# Patient Record
Sex: Female | Born: 2000 | Race: White | Hispanic: No | Marital: Single | Smoking: Never smoker
Health system: Southern US, Community
[De-identification: ages and names within clinical notes are randomized; demographics above are authoritative.]

## PROBLEM LIST (undated history)

## (undated) ENCOUNTER — Emergency Department (HOSPITAL_COMMUNITY): Payer: Managed Care, Other (non HMO)

## (undated) DIAGNOSIS — F319 Bipolar disorder, unspecified: Secondary | ICD-10-CM

## (undated) DIAGNOSIS — Z789 Other specified health status: Secondary | ICD-10-CM

## (undated) DIAGNOSIS — F419 Anxiety disorder, unspecified: Secondary | ICD-10-CM

## (undated) DIAGNOSIS — F4310 Post-traumatic stress disorder, unspecified: Secondary | ICD-10-CM

## (undated) DIAGNOSIS — H539 Unspecified visual disturbance: Secondary | ICD-10-CM

## (undated) DIAGNOSIS — E669 Obesity, unspecified: Secondary | ICD-10-CM

## (undated) HISTORY — DX: Post-traumatic stress disorder, unspecified: F43.10

## (undated) HISTORY — PX: NO PAST SURGERIES: SHX2092

## (undated) HISTORY — DX: Obesity, unspecified: E66.9

## (undated) HISTORY — DX: Anxiety disorder, unspecified: F41.9

---
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
## 2019-01-04 ENCOUNTER — Ambulatory Visit (HOSPITAL_COMMUNITY): Payer: Medicaid Other | Admitting: Psychiatry

## 2019-01-04 ENCOUNTER — Other Ambulatory Visit: Payer: Self-pay

## 2019-01-10 ENCOUNTER — Other Ambulatory Visit: Payer: Self-pay

## 2019-01-10 ENCOUNTER — Encounter (HOSPITAL_COMMUNITY): Payer: Medicaid Other | Admitting: Psychiatry

## 2019-01-10 NOTE — Progress Notes (Signed)
No show

## 2019-01-11 ENCOUNTER — Telehealth (HOSPITAL_COMMUNITY): Payer: Self-pay

## 2019-01-11 NOTE — Telephone Encounter (Signed)
[place] TRACKS PRESCRIPTION COVERAGE APPROVED  QUETIAPINE 50MG  TABLET PA# [id number] EFFECTIVE 01/09/2019 TO 01/04/2020

## 2019-02-22 ENCOUNTER — Other Ambulatory Visit (HOSPITAL_COMMUNITY): Payer: Self-pay | Admitting: Psychiatry

## 2019-02-22 DIAGNOSIS — F411 Generalized anxiety disorder: Secondary | ICD-10-CM

## 2019-02-22 DIAGNOSIS — F4310 Post-traumatic stress disorder, unspecified: Secondary | ICD-10-CM

## 2019-03-01 ENCOUNTER — Telehealth (HOSPITAL_COMMUNITY): Payer: Self-pay | Admitting: *Deleted

## 2019-03-01 NOTE — Telephone Encounter (Signed)
We can provide a 30-day sample and refills until she seen on her appointment.

## 2019-03-14 ENCOUNTER — Other Ambulatory Visit: Payer: Self-pay

## 2019-03-14 ENCOUNTER — Encounter (HOSPITAL_COMMUNITY): Payer: Self-pay | Admitting: Psychiatry

## 2019-03-14 ENCOUNTER — Ambulatory Visit (INDEPENDENT_AMBULATORY_CARE_PROVIDER_SITE_OTHER): Payer: Medicaid Other | Admitting: Psychiatry

## 2019-03-14 VITALS — Wt 260.0 lb

## 2019-03-14 DIAGNOSIS — F4310 Post-traumatic stress disorder, unspecified: Secondary | ICD-10-CM | POA: Diagnosis not present

## 2019-03-14 DIAGNOSIS — F3181 Bipolar II disorder: Secondary | ICD-10-CM | POA: Diagnosis not present

## 2019-03-14 MED ORDER — QUETIAPINE FUMARATE 25 MG PO TABS: 25.0000 mg | ORAL_TABLET | Freq: Every day | ORAL | 0 refills | Status: DC
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
## 2019-04-01 IMAGING — CT CT HEAD W/O CM
3 series · 15 of 46 positions shown, 18 images · non-contrast
Comparison: None.

CLINICAL DATA: Generalized illness

EXAM:
CT HEAD WITHOUT CONTRAST
TECHNIQUE: Contiguous axial images were obtained from the base of the skull
through the vertex without intravenous contrast.

[Series 2: head wo · axial · 0.47mm/px · z∈[+568,+688]mm · 9 of 29 slices shown, 12 images]
[im 3/29  brain]
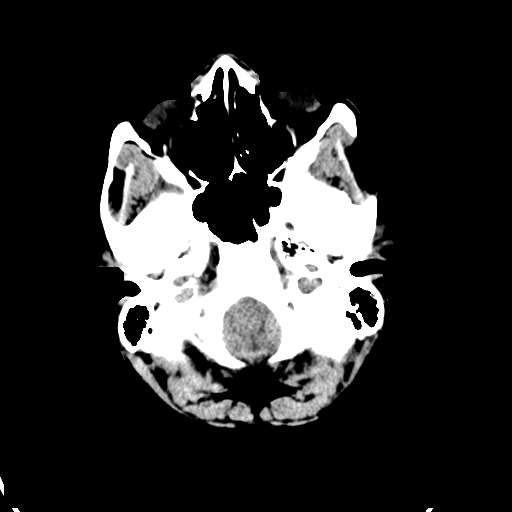
[im 3/29  bone]
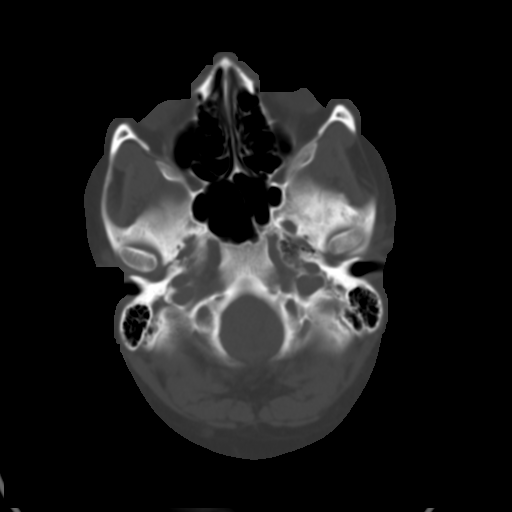
[im 6/29  brain]
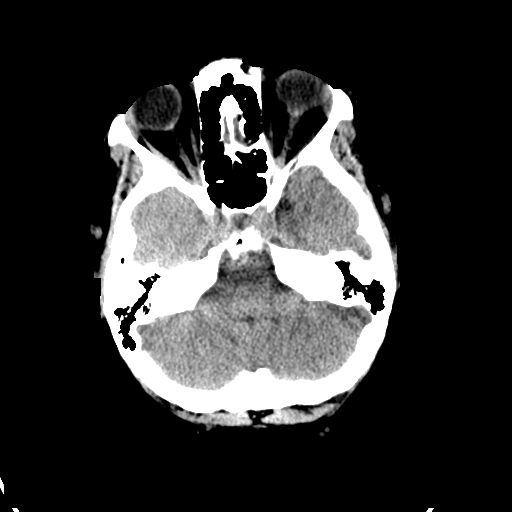
[im 9/29  brain]
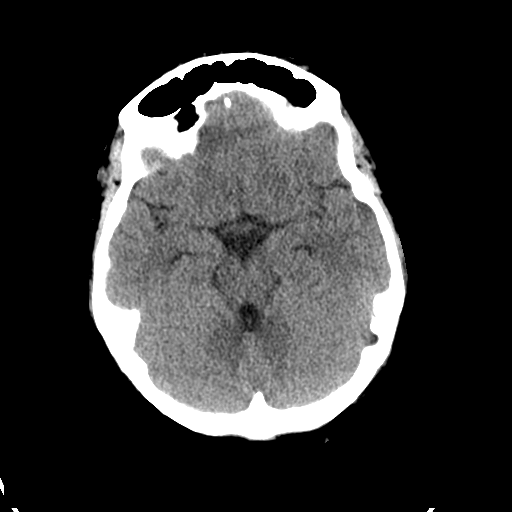
[im 12/29  brain]
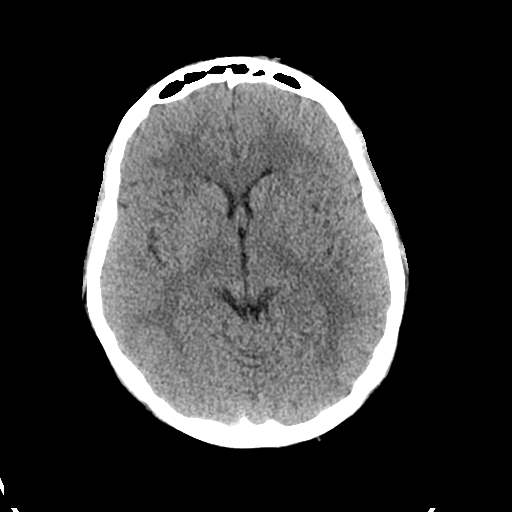
[im 15/29  brain]
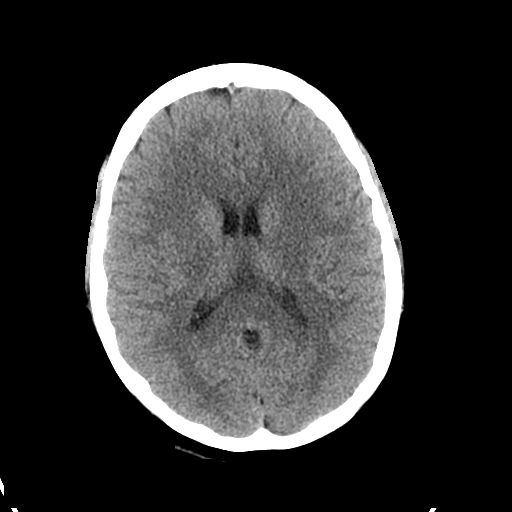
[im 15/29  bone]
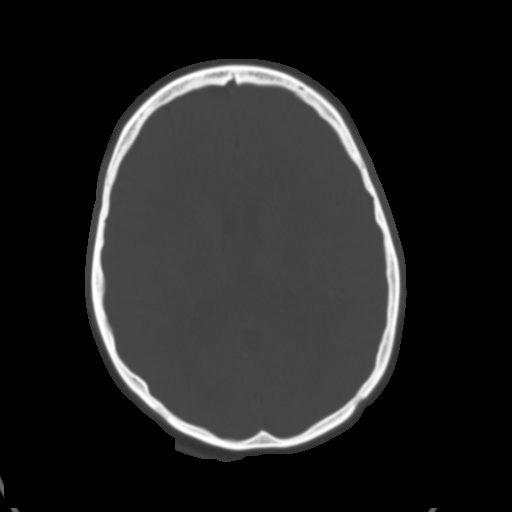
[im 18/29  brain]
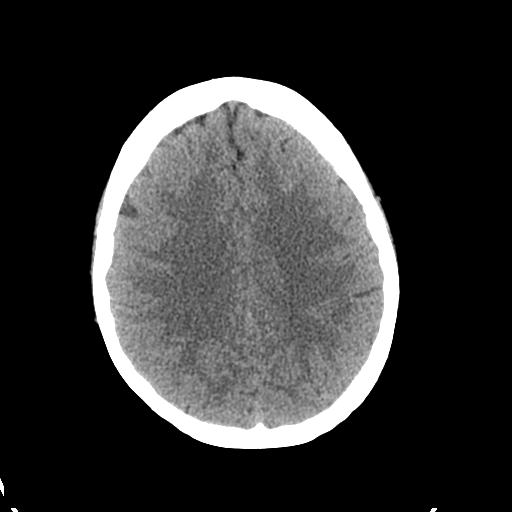
[im 21/29  brain]
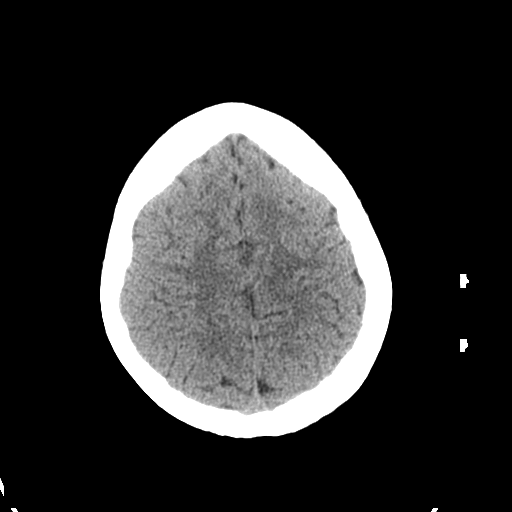
[im 24/29  brain]
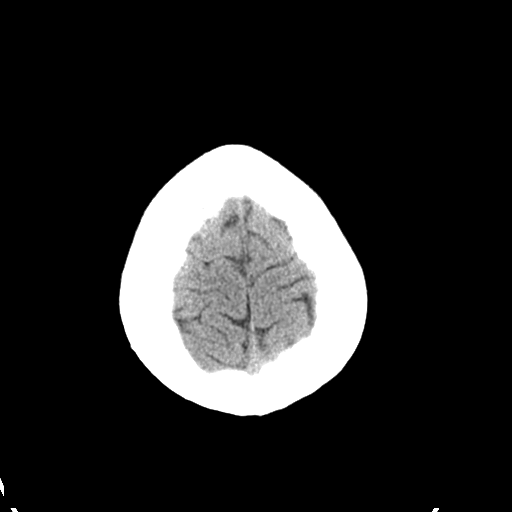
[im 27/29  brain]
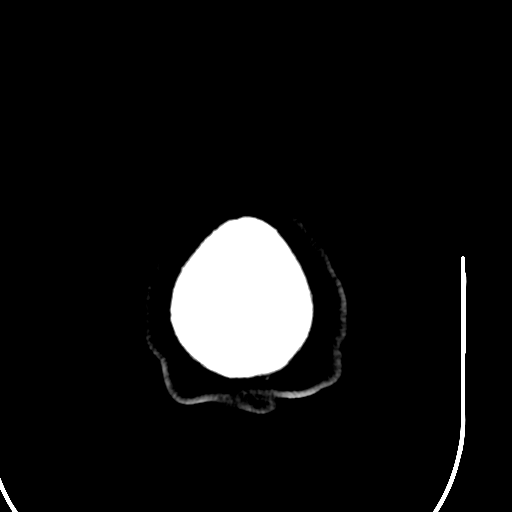
[im 27/29  bone]
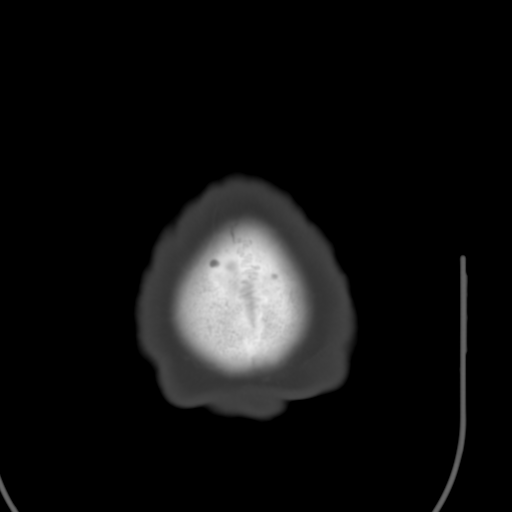

[Series 5: coronal soft tissue · coronal · 0.28mm/px · 3 of 71 slices shown]
[im 24/71  brain]
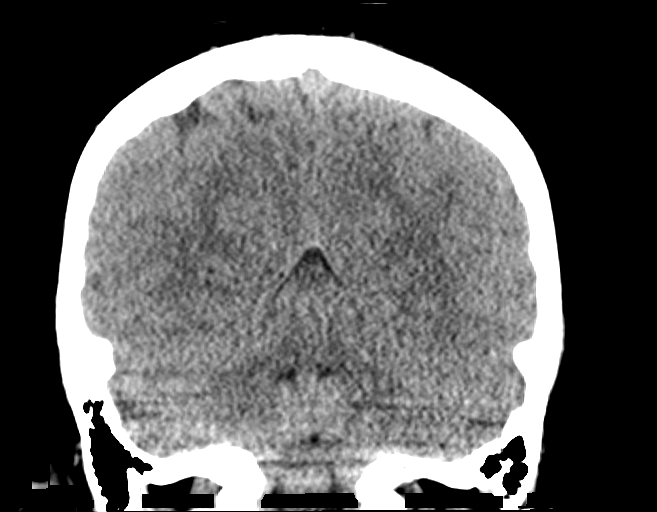
[im 32/71  brain]
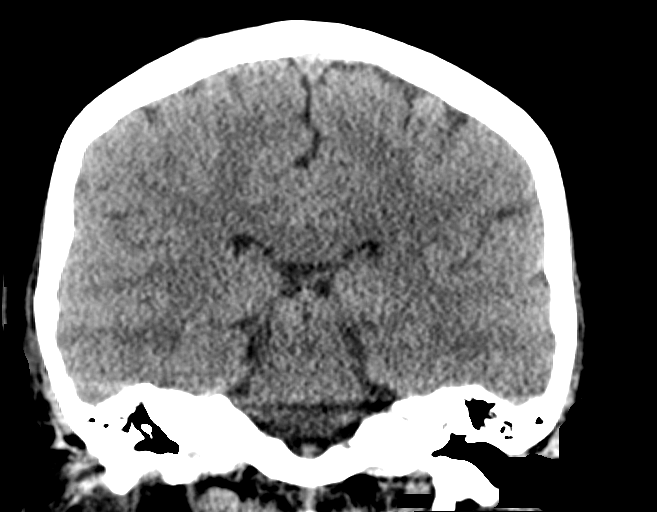
[im 39/71  brain]
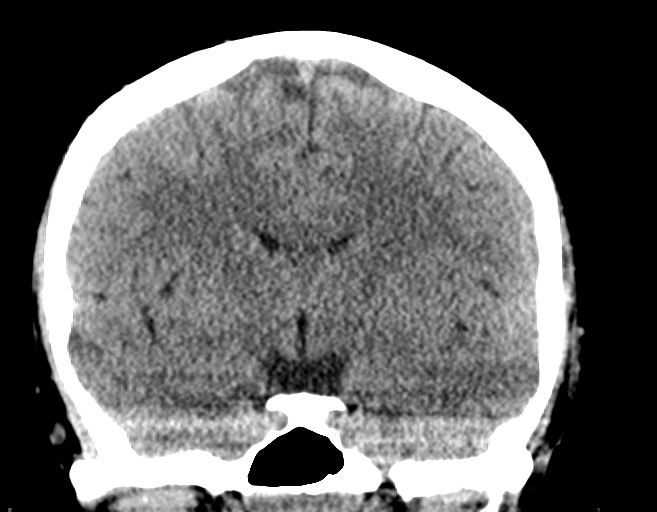

[Series 6: sagittal soft tissue · sagittal · 0.28mm/px · 3 of 61 slices shown]
[im 21/61  brain]
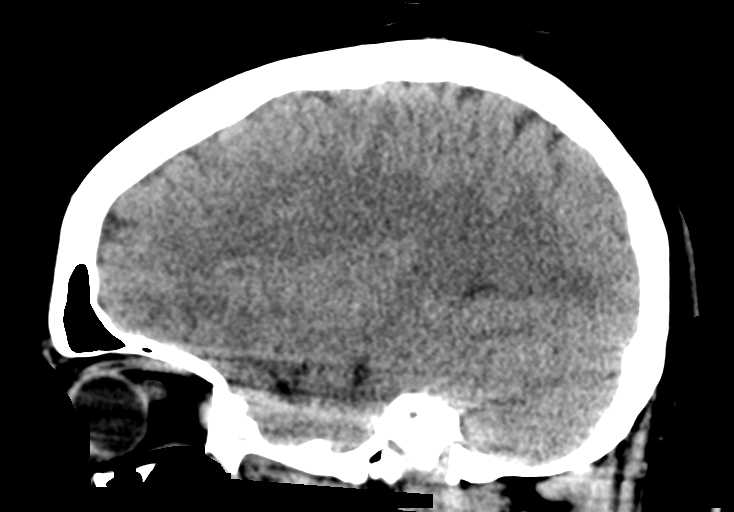
[im 31/61  brain]
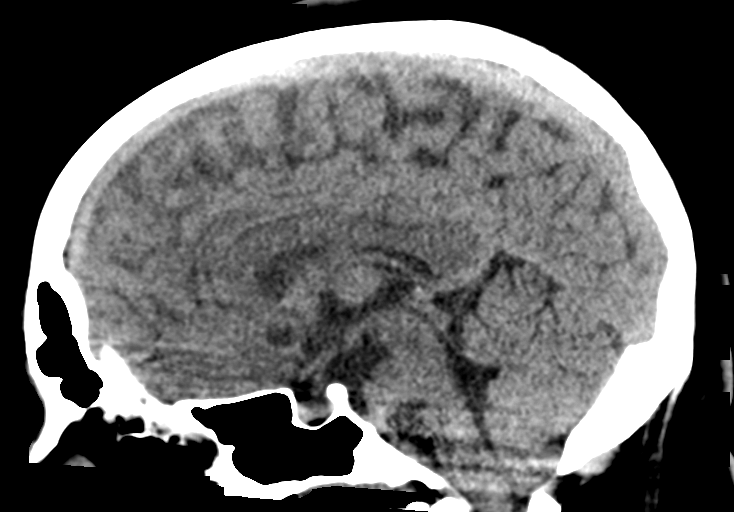
[im 41/61  brain]
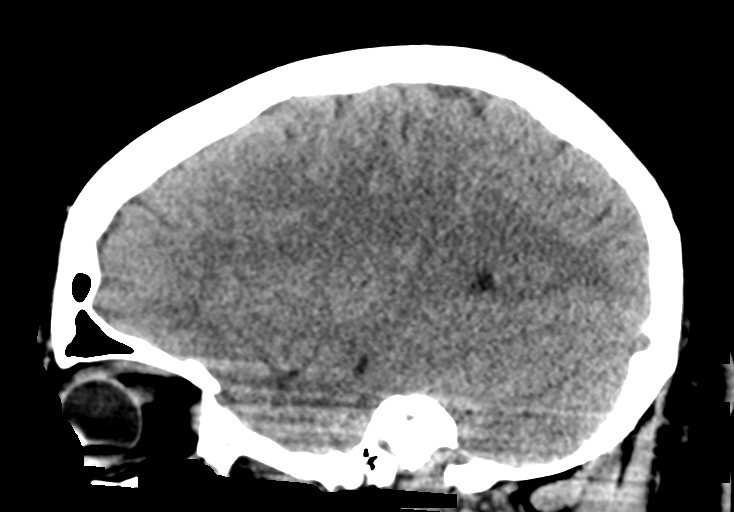

[15 of 46 positions shown; findings below may reference images not displayed]

FINDINGS: Brain: No evidence of acute infarction, hemorrhage, hydrocephalus,
extra-axial collection or mass lesion/mass effect.

Vascular: No hyperdense vessel or unexpected calcification.

Skull: Normal. Negative for fracture or focal lesion.

Sinuses/Orbits: No acute finding.

Other: None.
IMPRESSION: No acute intracranial abnormalities.

## 2019-05-05 ENCOUNTER — Other Ambulatory Visit (HOSPITAL_COMMUNITY): Payer: Self-pay | Admitting: Psychiatry

## 2019-05-05 DIAGNOSIS — F3181 Bipolar II disorder: Secondary | ICD-10-CM

## 2019-05-05 DIAGNOSIS — F4310 Post-traumatic stress disorder, unspecified: Secondary | ICD-10-CM

## 2019-05-15 ENCOUNTER — Encounter (HOSPITAL_COMMUNITY): Payer: Self-pay | Admitting: Psychiatry

## 2019-05-15 ENCOUNTER — Ambulatory Visit (INDEPENDENT_AMBULATORY_CARE_PROVIDER_SITE_OTHER): Payer: Medicaid Other | Admitting: Psychiatry

## 2019-05-15 ENCOUNTER — Other Ambulatory Visit: Payer: Self-pay

## 2019-05-15 DIAGNOSIS — F3181 Bipolar II disorder: Secondary | ICD-10-CM | POA: Diagnosis not present

## 2019-05-15 DIAGNOSIS — F4310 Post-traumatic stress disorder, unspecified: Secondary | ICD-10-CM

## 2019-05-15 MED ORDER — QUETIAPINE FUMARATE 25 MG PO TABS: 25.0000 mg | ORAL_TABLET | Freq: Every day | ORAL | 0 refills | Status: DC
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
## 2019-05-22 ENCOUNTER — Telehealth (HOSPITAL_COMMUNITY): Payer: Self-pay | Admitting: *Deleted

## 2019-05-22 ENCOUNTER — Other Ambulatory Visit (HOSPITAL_COMMUNITY): Payer: Self-pay | Admitting: *Deleted

## 2019-05-22 DIAGNOSIS — F3181 Bipolar II disorder: Secondary | ICD-10-CM

## 2019-05-22 DIAGNOSIS — F4310 Post-traumatic stress disorder, unspecified: Secondary | ICD-10-CM

## 2019-05-22 MED ORDER — QUETIAPINE FUMARATE 25 MG PO TABS: 25.0000 mg | ORAL_TABLET | Freq: Every day | ORAL | 0 refills | Status: DC

## 2019-05-22 NOTE — Telephone Encounter (Signed)
Pt called quite upset that she has been unable to refill the Seroquel 25mg  and that her pharmacy [facility] on is telling mother that pt has to wait 12 more days to refill. Epic showing rx sent in by MD on 05/15/19 #90. Writer was unable to get through on the [facility] pharmacy provider line. Mother asked if we could change pharmacy to the [place] on [address]. As she has had many issues with the other [facility] on [address]. Writer did transfer Rx to preferred pharmacy. Showing received. Mother instructed to call if an yissues arise.

## 2019-05-23 NOTE — Telephone Encounter (Signed)
Thanks for update

## 2019-08-09 ENCOUNTER — Encounter (HOSPITAL_COMMUNITY): Payer: Self-pay | Admitting: Psychiatry

## 2019-08-09 ENCOUNTER — Ambulatory Visit (INDEPENDENT_AMBULATORY_CARE_PROVIDER_SITE_OTHER): Payer: Medicaid Other | Admitting: Psychiatry

## 2019-08-09 ENCOUNTER — Other Ambulatory Visit: Payer: Self-pay

## 2019-08-09 DIAGNOSIS — F4310 Post-traumatic stress disorder, unspecified: Secondary | ICD-10-CM

## 2019-08-09 DIAGNOSIS — F3181 Bipolar II disorder: Secondary | ICD-10-CM

## 2019-08-09 MED ORDER — QUETIAPINE FUMARATE 25 MG PO TABS: 25.0000 mg | ORAL_TABLET | Freq: Every day | ORAL | 0 refills | Status: DC
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
## 2019-08-15 ENCOUNTER — Ambulatory Visit (HOSPITAL_COMMUNITY): Payer: Medicaid Other | Admitting: Psychiatry

## 2019-11-12 ENCOUNTER — Encounter (HOSPITAL_COMMUNITY): Payer: Self-pay | Admitting: Psychiatry

## 2019-11-12 ENCOUNTER — Other Ambulatory Visit: Payer: Self-pay

## 2019-11-12 ENCOUNTER — Telehealth (INDEPENDENT_AMBULATORY_CARE_PROVIDER_SITE_OTHER): Payer: Medicaid Other | Admitting: Psychiatry

## 2019-11-12 DIAGNOSIS — F4310 Post-traumatic stress disorder, unspecified: Secondary | ICD-10-CM

## 2019-11-12 DIAGNOSIS — F3181 Bipolar II disorder: Secondary | ICD-10-CM

## 2019-11-12 MED ORDER — QUETIAPINE FUMARATE 25 MG PO TABS: 25.0000 mg | ORAL_TABLET | Freq: Every day | ORAL | 0 refills | Status: DC
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
## 2019-12-31 ENCOUNTER — Other Ambulatory Visit (HOSPITAL_COMMUNITY): Payer: Self-pay | Admitting: Psychiatry

## 2019-12-31 DIAGNOSIS — F4310 Post-traumatic stress disorder, unspecified: Secondary | ICD-10-CM

## 2019-12-31 DIAGNOSIS — F3181 Bipolar II disorder: Secondary | ICD-10-CM

## 2020-02-12 ENCOUNTER — Encounter (HOSPITAL_COMMUNITY): Payer: Self-pay | Admitting: Psychiatry

## 2020-02-12 ENCOUNTER — Telehealth (INDEPENDENT_AMBULATORY_CARE_PROVIDER_SITE_OTHER): Payer: Medicaid Other | Admitting: Psychiatry

## 2020-02-12 ENCOUNTER — Other Ambulatory Visit: Payer: Self-pay

## 2020-02-12 DIAGNOSIS — F4310 Post-traumatic stress disorder, unspecified: Secondary | ICD-10-CM

## 2020-02-12 DIAGNOSIS — F3181 Bipolar II disorder: Secondary | ICD-10-CM | POA: Diagnosis not present

## 2020-02-12 MED ORDER — QUETIAPINE FUMARATE 25 MG PO TABS: 25.0000 mg | ORAL_TABLET | Freq: Every day | ORAL | 0 refills | Status: DC
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
## 2020-03-29 ENCOUNTER — Other Ambulatory Visit (HOSPITAL_COMMUNITY): Payer: Self-pay | Admitting: Psychiatry

## 2020-03-29 DIAGNOSIS — F4310 Post-traumatic stress disorder, unspecified: Secondary | ICD-10-CM

## 2020-03-29 DIAGNOSIS — F3181 Bipolar II disorder: Secondary | ICD-10-CM

## 2020-05-12 ENCOUNTER — Telehealth (INDEPENDENT_AMBULATORY_CARE_PROVIDER_SITE_OTHER): Payer: Medicaid Other | Admitting: Psychiatry

## 2020-05-12 ENCOUNTER — Other Ambulatory Visit: Payer: Self-pay

## 2020-05-12 ENCOUNTER — Encounter (HOSPITAL_COMMUNITY): Payer: Self-pay | Admitting: Psychiatry

## 2020-05-12 DIAGNOSIS — F4310 Post-traumatic stress disorder, unspecified: Secondary | ICD-10-CM

## 2020-05-12 DIAGNOSIS — F3181 Bipolar II disorder: Secondary | ICD-10-CM

## 2020-05-12 MED ORDER — QUETIAPINE FUMARATE 25 MG PO TABS: 25.0000 mg | ORAL_TABLET | Freq: Every day | ORAL | 0 refills | Status: DC
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
## 2020-05-14 ENCOUNTER — Other Ambulatory Visit (HOSPITAL_COMMUNITY): Payer: Self-pay | Admitting: Psychiatry

## 2020-05-14 DIAGNOSIS — F3181 Bipolar II disorder: Secondary | ICD-10-CM

## 2020-05-14 DIAGNOSIS — F4310 Post-traumatic stress disorder, unspecified: Secondary | ICD-10-CM

## 2020-07-02 ENCOUNTER — Other Ambulatory Visit: Payer: Self-pay

## 2020-07-02 ENCOUNTER — Encounter: Payer: Self-pay | Admitting: Emergency Medicine

## 2020-07-02 ENCOUNTER — Ambulatory Visit
Admission: EM | Admit: 2020-07-02 | Discharge: 2020-07-02 | Disposition: A | Discharge status: Home / Self Care | Payer: Medicaid Other | Attending: Emergency Medicine | Admitting: Emergency Medicine

## 2020-07-02 DIAGNOSIS — S0990XA Unspecified injury of head, initial encounter: Secondary | ICD-10-CM

## 2020-07-02 DIAGNOSIS — S060X0A Concussion without loss of consciousness, initial encounter: Secondary | ICD-10-CM

## 2020-07-02 HISTORY — DX: Bipolar disorder, unspecified: F31.9

## 2020-07-02 MED ORDER — IBUPROFEN 800 MG PO TABS: 800.0000 mg | ORAL_TABLET | Freq: Three times a day (TID) | ORAL | 0 refills | Status: DC
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
## 2020-07-02 NOTE — Discharge Instructions (Signed)
Ibuprofen and Tylenol for headache/soreness Rest and avoid screens Gradually ease back into work/activities, if you are developing symptoms please continue to rest Follow-up with sports medicine
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
## 2020-07-10 ENCOUNTER — Ambulatory Visit
Admission: EM | Admit: 2020-07-10 | Discharge: 2020-07-10 | Disposition: A | Discharge status: Home / Self Care | Payer: Medicaid Other | Attending: Nurse Practitioner | Admitting: Nurse Practitioner

## 2020-07-10 ENCOUNTER — Other Ambulatory Visit: Payer: Self-pay | Admitting: Nurse Practitioner

## 2020-07-10 ENCOUNTER — Other Ambulatory Visit: Payer: Self-pay

## 2020-07-10 DIAGNOSIS — J069 Acute upper respiratory infection, unspecified: Secondary | ICD-10-CM

## 2020-07-10 DIAGNOSIS — Z20822 Contact with and (suspected) exposure to covid-19: Secondary | ICD-10-CM

## 2020-07-10 DIAGNOSIS — N912 Amenorrhea, unspecified: Secondary | ICD-10-CM

## 2020-07-10 LAB — POCT URINE PREGNANCY: Preg Test, Ur: POSITIVE — AB

## 2020-07-10 MED ORDER — OSELTAMIVIR PHOSPHATE 75 MG PO CAPS: 75.0000 mg | ORAL_CAPSULE | Freq: Two times a day (BID) | ORAL | 0 refills | Status: DC

## 2020-07-10 MED ORDER — GUAIFENESIN 100 MG/5ML PO SYRP: 200.0000 mg | ORAL_SOLUTION | ORAL | 0 refills | Status: DC | PRN

## 2020-07-10 MED ORDER — ALBUTEROL SULFATE HFA 108 (90 BASE) MCG/ACT IN AERS: 1.0000 | INHALATION_SPRAY | Freq: Four times a day (QID) | RESPIRATORY_TRACT | 0 refills | Status: DC | PRN

## 2020-07-10 NOTE — Discharge Instructions (Addendum)
Take medications as prescribed. You may take tylenol as needed for fevers/headache/body aches. Drink plenty of fluids. Stay in home isolation until you receive results of your COVID test. You will only be notified for positive results. You may go online to MyChart in the next few days and review your results. Go to the ED immediately if you get worse or have any other symptoms.

## 2020-07-10 NOTE — ED Triage Notes (Signed)
Pt c/o fever 101.1 yesterday with cough, chest congestion, and body aches. States took ibuprofen and benadryl yesterday.
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
## 2020-07-11 LAB — COVID-19, FLU A+B NAA
Influenza A, NAA: NOT DETECTED
Influenza B, NAA: NOT DETECTED
SARS-CoV-2, NAA: NOT DETECTED

## 2020-07-12 LAB — SPECIMEN STATUS REPORT

## 2020-07-12 LAB — BETA HCG QUANT (REF LAB): hCG Quant: 26 m[IU]/mL

## 2020-07-16 ENCOUNTER — Telehealth (INDEPENDENT_AMBULATORY_CARE_PROVIDER_SITE_OTHER): Payer: Medicaid Other | Admitting: Psychiatry

## 2020-07-16 ENCOUNTER — Encounter (HOSPITAL_COMMUNITY): Payer: Self-pay | Admitting: Psychiatry

## 2020-07-16 ENCOUNTER — Other Ambulatory Visit: Payer: Self-pay

## 2020-07-16 VITALS — Wt 242.0 lb

## 2020-07-16 DIAGNOSIS — F4310 Post-traumatic stress disorder, unspecified: Secondary | ICD-10-CM | POA: Diagnosis not present

## 2020-07-16 DIAGNOSIS — F3181 Bipolar II disorder: Secondary | ICD-10-CM | POA: Diagnosis not present
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
## 2020-08-06 ENCOUNTER — Telehealth (HOSPITAL_COMMUNITY): Payer: Medicaid Other | Admitting: Psychiatry

## 2020-08-25 DIAGNOSIS — O09899 Supervision of other high risk pregnancies, unspecified trimester: Secondary | ICD-10-CM | POA: Insufficient documentation

## 2020-08-25 DIAGNOSIS — Z2839 Other underimmunization status: Secondary | ICD-10-CM | POA: Insufficient documentation

## 2020-10-08 ENCOUNTER — Other Ambulatory Visit: Payer: Self-pay

## 2020-10-08 ENCOUNTER — Telehealth (INDEPENDENT_AMBULATORY_CARE_PROVIDER_SITE_OTHER): Payer: BC Managed Care – PPO | Admitting: Psychiatry

## 2020-10-08 ENCOUNTER — Encounter (HOSPITAL_COMMUNITY): Payer: Self-pay | Admitting: Psychiatry

## 2020-10-08 VITALS — Wt 250.0 lb

## 2020-10-08 DIAGNOSIS — F4310 Post-traumatic stress disorder, unspecified: Secondary | ICD-10-CM

## 2020-10-08 DIAGNOSIS — F3181 Bipolar II disorder: Secondary | ICD-10-CM | POA: Diagnosis not present
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
## 2020-10-14 ENCOUNTER — Other Ambulatory Visit: Payer: Self-pay

## 2020-10-14 ENCOUNTER — Ambulatory Visit (INDEPENDENT_AMBULATORY_CARE_PROVIDER_SITE_OTHER): Payer: BC Managed Care – PPO | Admitting: Licensed Clinical Social Worker

## 2020-10-14 DIAGNOSIS — F419 Anxiety disorder, unspecified: Secondary | ICD-10-CM

## 2020-10-14 DIAGNOSIS — F32A Depression, unspecified: Secondary | ICD-10-CM | POA: Diagnosis not present

## 2020-10-14 DIAGNOSIS — F4310 Post-traumatic stress disorder, unspecified: Secondary | ICD-10-CM | POA: Diagnosis not present
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
## 2020-10-22 ENCOUNTER — Other Ambulatory Visit: Payer: Self-pay

## 2020-10-22 ENCOUNTER — Telehealth (INDEPENDENT_AMBULATORY_CARE_PROVIDER_SITE_OTHER): Payer: BC Managed Care – PPO | Admitting: Psychiatry

## 2020-10-22 DIAGNOSIS — F3181 Bipolar II disorder: Secondary | ICD-10-CM

## 2020-10-22 DIAGNOSIS — F4310 Post-traumatic stress disorder, unspecified: Secondary | ICD-10-CM

## 2020-10-22 MED ORDER — HALOPERIDOL 1 MG PO TABS: ORAL_TABLET | ORAL | 2 refills | Status: DC
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
## 2020-10-27 ENCOUNTER — Telehealth (HOSPITAL_COMMUNITY): Payer: Self-pay | Admitting: *Deleted

## 2020-10-27 NOTE — Telephone Encounter (Signed)
I am faxing GeneSight results to her OB/GYN at [facility].  Pt agreeable.

## 2020-10-27 NOTE — Telephone Encounter (Signed)
She had tried SSRIs in the past (list is in the chart).  Her Gene sight testing shows Viibryd, Trintellix and Pristiq and she can discuss with her OB/GYN about these medication but they are not SSRIs.

## 2020-10-27 NOTE — Telephone Encounter (Signed)
Pt called stating that she is not going to take the Haldol as her gyn suggests SSRI instead and she has been looking up s/e and so does not intend to take med. Pt has an appointment scheduled for 11/25/20. Please review and advise. Thanks.

## 2020-10-29 ENCOUNTER — Ambulatory Visit (INDEPENDENT_AMBULATORY_CARE_PROVIDER_SITE_OTHER): Payer: BC Managed Care – PPO | Admitting: Clinical

## 2020-10-29 ENCOUNTER — Other Ambulatory Visit: Payer: Self-pay

## 2020-10-29 DIAGNOSIS — F419 Anxiety disorder, unspecified: Secondary | ICD-10-CM

## 2020-10-29 DIAGNOSIS — F4310 Post-traumatic stress disorder, unspecified: Secondary | ICD-10-CM

## 2020-10-29 DIAGNOSIS — F32A Depression, unspecified: Secondary | ICD-10-CM
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
## 2020-11-12 ENCOUNTER — Other Ambulatory Visit: Payer: Self-pay

## 2020-11-12 ENCOUNTER — Encounter (HOSPITAL_COMMUNITY): Payer: Self-pay | Admitting: Clinical

## 2020-11-12 ENCOUNTER — Ambulatory Visit (HOSPITAL_COMMUNITY): Payer: BC Managed Care – PPO | Admitting: Clinical

## 2020-11-25 ENCOUNTER — Encounter (HOSPITAL_COMMUNITY): Payer: Self-pay | Admitting: Psychiatry

## 2020-11-25 ENCOUNTER — Telehealth (INDEPENDENT_AMBULATORY_CARE_PROVIDER_SITE_OTHER): Payer: BC Managed Care – PPO | Admitting: Psychiatry

## 2020-11-25 ENCOUNTER — Other Ambulatory Visit: Payer: Self-pay

## 2020-11-25 VITALS — Wt 254.0 lb

## 2020-11-25 DIAGNOSIS — F3181 Bipolar II disorder: Secondary | ICD-10-CM | POA: Diagnosis not present

## 2020-11-25 DIAGNOSIS — F4310 Post-traumatic stress disorder, unspecified: Secondary | ICD-10-CM | POA: Diagnosis not present
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
## 2020-11-26 ENCOUNTER — Other Ambulatory Visit: Payer: Self-pay

## 2020-11-26 ENCOUNTER — Ambulatory Visit (INDEPENDENT_AMBULATORY_CARE_PROVIDER_SITE_OTHER): Payer: BC Managed Care – PPO | Admitting: Clinical

## 2020-11-26 DIAGNOSIS — F32A Depression, unspecified: Secondary | ICD-10-CM

## 2020-11-26 DIAGNOSIS — F419 Anxiety disorder, unspecified: Secondary | ICD-10-CM | POA: Diagnosis not present

## 2020-11-26 DIAGNOSIS — F4310 Post-traumatic stress disorder, unspecified: Secondary | ICD-10-CM | POA: Diagnosis not present
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
## 2020-12-10 ENCOUNTER — Other Ambulatory Visit: Payer: Self-pay

## 2020-12-10 ENCOUNTER — Ambulatory Visit (INDEPENDENT_AMBULATORY_CARE_PROVIDER_SITE_OTHER): Payer: BC Managed Care – PPO | Admitting: Clinical

## 2020-12-10 DIAGNOSIS — F4310 Post-traumatic stress disorder, unspecified: Secondary | ICD-10-CM | POA: Diagnosis not present

## 2020-12-10 DIAGNOSIS — F32A Depression, unspecified: Secondary | ICD-10-CM | POA: Diagnosis not present

## 2020-12-10 DIAGNOSIS — F419 Anxiety disorder, unspecified: Secondary | ICD-10-CM

## 2020-12-10 NOTE — Progress Notes (Signed)
   THERAPIST PROGRESS NOTE  Session Time: 10am  Participation Level: Active  Behavioral Response: CasualAlertEuthymic  Type of Therapy: Individual Therapy  Treatment Goals addressed: Coping  Interventions: Other: psychoeducation  Summary: . Pt presents in a euthymic mood. Pt reports work is going well and she is able to balance her duties. Pt reports having a strong support system has assisted her in managing unwanted emotions and distressing events. Pt states drawing/painting, caring for animals and fixing things are a part of her self care routine and assist her in coping with unwanted emotions. Pt reports social support and coping skills are her most valuable protective factors.  Suicidal/Homicidal: Pt denies SI/HI no plan or intent to harm self of others reported.    Therapist Response: Todays session spent discussing what protective factors are and the protective factors that are in and out of the pt control. CSW actively listened as pt gave herself strong and moderate ratings in physical health, sense of purpose and self esteem protective factors. CSW probed for feedback on how her most valuable protective factors are used during difficult times.  Plan: Pt request to return for follow up appt in 1 month. Pt states she is pleased with the progress she is making and request 1 month follow up appt.  Diagnosis: Axis I: PTSD                                     Anxiety and depression    Axis II: No diagnosis    Suzan Slick, LCSW 12/10/2020

## 2021-01-08 ENCOUNTER — Ambulatory Visit (INDEPENDENT_AMBULATORY_CARE_PROVIDER_SITE_OTHER): Payer: BC Managed Care – PPO | Admitting: Clinical

## 2021-01-08 ENCOUNTER — Other Ambulatory Visit: Payer: Self-pay

## 2021-01-08 ENCOUNTER — Telehealth (HOSPITAL_COMMUNITY): Payer: BC Managed Care – PPO | Admitting: Psychiatry

## 2021-01-08 DIAGNOSIS — F4310 Post-traumatic stress disorder, unspecified: Secondary | ICD-10-CM | POA: Diagnosis not present

## 2021-01-08 DIAGNOSIS — F32A Depression, unspecified: Secondary | ICD-10-CM | POA: Diagnosis not present

## 2021-01-08 DIAGNOSIS — F419 Anxiety disorder, unspecified: Secondary | ICD-10-CM | POA: Diagnosis not present

## 2021-01-08 NOTE — Progress Notes (Signed)
   THERAPIST PROGRESS NOTE  Session Time: 10am  Participation Level: Active  Behavioral Response: CasualAlert"rollercoaster"  Type of Therapy: Individual Therapy  Treatment Goals addressed: Coping  Interventions: Supportive  Summary: Pt describes her mood as a "rollercoaster" pt says she is 7 months pregnant and experiencing hormonal/mood changes. When asked to rate her overall mood pt says she is a 5/10(10=positive mood). Pt reports she was previously prescribed seroquel but physician stopped medication when she discovered she was pregnant. Since stopping the medication, pt says she has been experiencing mild depressive sxs. Pt discussed experiencing periods of sadness during the anniversary date of her deceased pet. Nevertheless, pt states having a strong support system is assisting her with managing mood changes and distressing events. Suicidal/Homicidal: Pt denies SI/HI no plan or intent to harm self of others reported.  Therapist Response: CSW actively listened as pt identified mood changes that have been occurring during her pregnancy. CSW probed for feedback on coping strategies to manage mood changes. CSW reviewed and provided pt with emotion regulation strategies to help with mood management.  Plan: Return again in 4 weeks.  Diagnosis: Axis I: PTSD                                     Anxiety and depression    Axis II: No diagnosis    Suzan Slick, LCSW 01/08/2021

## 2021-01-22 ENCOUNTER — Encounter (HOSPITAL_COMMUNITY): Payer: Self-pay | Admitting: Psychiatry

## 2021-01-22 ENCOUNTER — Other Ambulatory Visit: Payer: Self-pay

## 2021-01-22 ENCOUNTER — Telehealth (HOSPITAL_BASED_OUTPATIENT_CLINIC_OR_DEPARTMENT_OTHER): Payer: BC Managed Care – PPO | Admitting: Psychiatry

## 2021-01-22 VITALS — Wt 264.0 lb

## 2021-01-22 DIAGNOSIS — F4310 Post-traumatic stress disorder, unspecified: Secondary | ICD-10-CM | POA: Diagnosis not present

## 2021-01-22 DIAGNOSIS — F3181 Bipolar II disorder: Secondary | ICD-10-CM | POA: Diagnosis not present
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
## 2021-02-10 ENCOUNTER — Other Ambulatory Visit: Payer: Self-pay

## 2021-02-10 ENCOUNTER — Ambulatory Visit (INDEPENDENT_AMBULATORY_CARE_PROVIDER_SITE_OTHER): Payer: BC Managed Care – PPO | Admitting: Clinical

## 2021-02-10 DIAGNOSIS — F4310 Post-traumatic stress disorder, unspecified: Secondary | ICD-10-CM | POA: Diagnosis not present

## 2021-02-10 DIAGNOSIS — F419 Anxiety disorder, unspecified: Secondary | ICD-10-CM

## 2021-02-10 DIAGNOSIS — F32A Depression, unspecified: Secondary | ICD-10-CM

## 2021-02-10 NOTE — Progress Notes (Signed)
   THERAPIST PROGRESS NOTE  Session Time: 10am  Participation Level: Active  Behavioral Response:  unknown appearanceAlertpleasant  Type of Therapy: Individual Therapy  Treatment Goals addressed: Coping  Interventions: Supportive Virtual Visit via Telephone Note  I connected with Tina Jones on 02/10/21 at 10:00 AM EST by telephone and verified that I am speaking with the correct person using two identifiers.  Location: Patient: home Provider: office   I discussed the limitations, risks, security and privacy concerns of performing an evaluation and management service by telephone and the availability of in person appointments. I also discussed with the patient that there may be a patient responsible charge related to this service. The patient expressed understanding and agreed to proceed.   I discussed the assessment and treatment plan with the patient. The patient was provided an opportunity to ask questions and all were answered. The patient agreed with the plan and demonstrated an understanding of the instructions.   The patient was advised to call back or seek an in-person evaluation if the symptoms worsen or if the condition fails to improve as anticipated.  I provided 40 minutes of non-face-to-face time during this encounter.  Summary: Pt presents in a pleasant mood. Pt states she has felt like she has been on a Insurance risk surveyor during the holiday" Per pt report a few weeks before a holiday she experiences periods of low mood and grieves the loss of her deceased relatives and pets. Pt states 03-03-2023 is her fathers death anniversary. Pt reports increase in anxiety as she prepares for her childs birth in January. Morevover, pt says she finds painting as a healthy distraction and a coping method to manage stress. Pt reports she has not been prescribed any psychotropic medications during her pregnancy.  Suicidal/Homicidal: Pt denies SI/HI no plan, intent or attempt to harm self or others  reported.  Therapist Response: Todays session spent discussing grieving process. CSW provided pt with information on the stages of grief and tips on managing grief during the holiday season.   Plan: Return again in 4 weeks.  Diagnosis: Axis I: PTSD                                     Anxiety and depression    Axis II: No diagnosis    Suzan Slick, LCSW 02/10/2021

## 2021-03-03 ENCOUNTER — Ambulatory Visit (INDEPENDENT_AMBULATORY_CARE_PROVIDER_SITE_OTHER): Payer: BC Managed Care – PPO | Admitting: Clinical

## 2021-03-03 ENCOUNTER — Other Ambulatory Visit: Payer: Self-pay

## 2021-03-03 DIAGNOSIS — F419 Anxiety disorder, unspecified: Secondary | ICD-10-CM | POA: Diagnosis not present

## 2021-03-03 DIAGNOSIS — F32A Depression, unspecified: Secondary | ICD-10-CM | POA: Diagnosis not present

## 2021-03-03 DIAGNOSIS — F4310 Post-traumatic stress disorder, unspecified: Secondary | ICD-10-CM

## 2021-03-03 NOTE — Progress Notes (Signed)
° °  THERAPIST PROGRESS NOTE  Session Time: 10am  Participation Level: Active  Behavioral Response: AlertEuthymic  Type of Therapy: Individual Therapy  Treatment Goals addressed: Coping  Interventions: Supportive Virtual Visit via Telephone Note  I connected with Tina Jones on 03/03/21 at 10:00 AM EST by telephone and verified that I am speaking with the correct person using two identifiers.  Location: Patient: home Provider: office   I discussed the limitations, risks, security and privacy concerns of performing an evaluation and management service by telephone and the availability of in person appointments. I also discussed with the patient that there may be a patient responsible charge related to this service. The patient expressed understanding and agreed to proceed.   I discussed the assessment and treatment plan with the patient. The patient was provided an opportunity to ask questions and all were answered. The patient agreed with the plan and demonstrated an understanding of the instructions.   The patient was advised to call back or seek an in-person evaluation if the symptoms worsen or if the condition fails to improve as anticipated.  I provided of non-face-to-face time during this encounter.  Summary: Pt says she is losing her voice, difficult to understand her at times. Pt reports no concerns and says her mood is stable. Pt says she is preparing for the arrival of her son on 1/10. Pt does not report any changes in sleep pattern, no nightmare or flashback reported. Pt states she has a medication management appt schedule in February where she plans to begin taking psychotropic medication. Pt says no psychotropic medications have been prescribed during her pregnancy. Pt reports having strong support system consisting of her boyfriend and family members.  Suicidal/Homicidal:   Pt denies SI/HI no plan, intent or attempt to harm self or others reported Therapist  Response: Assessed for changes in mood, behavior and daily functioning. Todays session consisted of discussing stress management techniques. Pt identifies cooking, watching cooking TV shows, playing with her pets and drawing in her art book as things that assist her in relaxing. Discussed with pt incorporating these things in her routine when her child is born.  Plan: Return again in 4 weeks.  Diagnosis: Axis I: PTSD                                     Anxiety and depression    Axis II: No diagnosis    Suzan Slick, LCSW 03/03/2021

## 2021-04-07 ENCOUNTER — Encounter: Payer: Self-pay | Admitting: Emergency Medicine

## 2021-04-07 ENCOUNTER — Ambulatory Visit
Admission: EM | Admit: 2021-04-07 | Discharge: 2021-04-07 | Disposition: A | Discharge status: Home / Self Care | Payer: BC Managed Care – PPO | Attending: Internal Medicine | Admitting: Internal Medicine

## 2021-04-07 ENCOUNTER — Other Ambulatory Visit: Payer: Self-pay

## 2021-04-07 DIAGNOSIS — M25532 Pain in left wrist: Secondary | ICD-10-CM

## 2021-04-07 NOTE — Discharge Instructions (Signed)
A wrist brace has been applied.  Please use ice application as well.  You may take Tylenol as needed for pain.  Follow-up with provided contact information for orthopedist for further evaluation and management.

## 2021-04-07 NOTE — ED Triage Notes (Signed)
Pt here for left wrist pain x 3 days; pt sts woke up in funny position from sleep but denies other injury
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
## 2021-04-10 ENCOUNTER — Encounter (HOSPITAL_COMMUNITY): Payer: Self-pay | Admitting: Psychiatry

## 2021-04-10 ENCOUNTER — Other Ambulatory Visit: Payer: Self-pay

## 2021-04-10 ENCOUNTER — Telehealth (HOSPITAL_BASED_OUTPATIENT_CLINIC_OR_DEPARTMENT_OTHER): Payer: Medicaid Other | Admitting: Psychiatry

## 2021-04-10 DIAGNOSIS — F4310 Post-traumatic stress disorder, unspecified: Secondary | ICD-10-CM

## 2021-04-10 DIAGNOSIS — F53 Postpartum depression: Secondary | ICD-10-CM | POA: Diagnosis not present

## 2021-04-10 DIAGNOSIS — F3181 Bipolar II disorder: Secondary | ICD-10-CM

## 2021-04-10 MED ORDER — QUETIAPINE FUMARATE 25 MG PO TABS: 12.5000 mg | ORAL_TABLET | Freq: Every day | ORAL | 0 refills | Status: DC
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
## 2021-04-13 ENCOUNTER — Ambulatory Visit (INDEPENDENT_AMBULATORY_CARE_PROVIDER_SITE_OTHER): Payer: Medicaid Other | Admitting: Clinical

## 2021-04-13 ENCOUNTER — Other Ambulatory Visit: Payer: Self-pay

## 2021-04-13 DIAGNOSIS — F39 Unspecified mood [affective] disorder: Secondary | ICD-10-CM

## 2021-04-13 DIAGNOSIS — F4310 Post-traumatic stress disorder, unspecified: Secondary | ICD-10-CM

## 2021-04-13 DIAGNOSIS — F53 Postpartum depression: Secondary | ICD-10-CM

## 2021-04-13 NOTE — Progress Notes (Signed)
° °  THERAPIST PROGRESS NOTE  Session Time: 10am  Participation Level: Active  Behavioral Response: Alertpleasant  Type of Therapy: Individual Therapy  Treatment Goals addressed: Coping  Interventions: CBT Virtual Visit via Telephone Note  I connected with Tina Jones on 04/13/21 at 10:00 AM EST by telephone and verified that I am speaking with the correct person using two identifiers.  Location: Patient: home Provider: office   I discussed the limitations, risks, security and privacy concerns of performing an evaluation and management service by telephone and the availability of in person appointments. I also discussed with the patient that there may be a patient responsible charge related to this service. The patient expressed understanding and agreed to proceed.   I discussed the assessment and treatment plan with the patient. The patient was provided an opportunity to ask questions and all were answered. The patient agreed with the plan and demonstrated an understanding of the instructions.   The patient was advised to call back or seek an in-person evaluation if the symptoms worsen or if the condition fails to improve as anticipated.  I provided 35 minutes of non-face-to-face time during this encounter.  Summary: Pt reports giving birth to a son. Pt says she is staying with her mother until next appointment and says mother and boyfriend have been very supportive. Pt says she has been experiencing "mom guilt" (thoughts she is not a good mother, when unable to calm and soothe baby). Pt also reports crying spells, increased anxiety, irritability, feeling overwhelmed. Pt states she began taking seroquel to assist with mood management after being off medication during pregnancy.  Suicidal/Homicidal: Pt denies SI/HI no plan, intent or attempt to harm self or others reported. Pt encouraged to call 911 or go to closest ED in an emergency.  Therapist Response: Assessed for changes in mood,  behavior and daily functioning. Discussed cognitive model(thoughts influence feelings and behaviors). Processed putting thoughts on trial to challenge irrational thoughts. Reviewed healthy coping strategies to manage stress such as drawing/painting, getting fresh air/being in nature, social supports.  Plan: Return again in 4 weeks.  Diagnosis: Axis I: Mood disorder    PTSD    Post partum depression    Axis II: No diagnosis    Suzan Slick, LCSW 04/13/2021

## 2021-04-23 ENCOUNTER — Telehealth (HOSPITAL_COMMUNITY): Payer: BC Managed Care – PPO | Admitting: Psychiatry

## 2021-05-08 ENCOUNTER — Encounter (HOSPITAL_COMMUNITY): Payer: Self-pay | Admitting: Psychiatry

## 2021-05-08 ENCOUNTER — Telehealth (HOSPITAL_BASED_OUTPATIENT_CLINIC_OR_DEPARTMENT_OTHER): Payer: Medicaid Other | Admitting: Psychiatry

## 2021-05-08 ENCOUNTER — Other Ambulatory Visit: Payer: Self-pay

## 2021-05-08 VITALS — Wt 252.0 lb

## 2021-05-08 DIAGNOSIS — F3181 Bipolar II disorder: Secondary | ICD-10-CM

## 2021-05-08 DIAGNOSIS — F4310 Post-traumatic stress disorder, unspecified: Secondary | ICD-10-CM

## 2021-05-08 MED ORDER — LAMOTRIGINE 5 MG PO CHEW: 5.0000 mg | CHEWABLE_TABLET | Freq: Every day | ORAL | 1 refills | Status: DC
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
## 2021-05-11 ENCOUNTER — Other Ambulatory Visit: Payer: Self-pay

## 2021-05-11 ENCOUNTER — Ambulatory Visit (INDEPENDENT_AMBULATORY_CARE_PROVIDER_SITE_OTHER): Payer: Medicaid Other | Admitting: Clinical

## 2021-05-11 DIAGNOSIS — F4310 Post-traumatic stress disorder, unspecified: Secondary | ICD-10-CM

## 2021-05-11 DIAGNOSIS — F53 Postpartum depression: Secondary | ICD-10-CM

## 2021-05-11 NOTE — Progress Notes (Signed)
° °  THERAPIST PROGRESS NOTE  Session Time: 10am  Participation Level: Active  Behavioral Response: AlertEuthymic  Type of Therapy: Individual Therapy  Treatment Goals addressed: Coping strategies  ProgressTowards Goals: Progressing  Interventions: Supportive Virtual Visit via Telephone Note  I connected with Tina Jones on 05/11/21 at 10:00 AM EST by telephone and verified that I am speaking with the correct person using two identifiers.  Location: Patient: home Provider: office   I discussed the limitations, risks, security and privacy concerns of performing an evaluation and management service by telephone and the availability of in person appointments. I also discussed with the patient that there may be a patient responsible charge related to this service. The patient expressed understanding and agreed to proceed.   I discussed the assessment and treatment plan with the patient. The patient was provided an opportunity to ask questions and all were answered. The patient agreed with the plan and demonstrated an understanding of the instructions.   The patient was advised to call back or seek an in-person evaluation if the symptoms worsen or if the condition fails to improve as anticipated.  I provided 30 minutes of non-face-to-face time during this encounter.  Summary: Pt presents in euthymic mood. Pt says her son will be 2 months old soon and she is adjusting to motherhood. Pt reports her mother and boyfriend have been very supportive. Pt reports post partum sxs have subsided and mood has improved. Pt states psychiatrist(Dr. Lolly Mustache) prescribed seroquel but pt states it caused her to sleep too much. Per pt she stopped taking the medication two weeks ago. Pt says she has been prescribed a new medication (unable to recall name) but has not began taking it.  Suicidal/Homicidal: Pt denies SI/HI no plan, intent or attempt to harm self or others reported. Pt encouraged to call 911 or go to  closest ED in an emergency.  Therapist Response: Actively listened and supported pt as she discussed her transition into motherhood. Probed for pt self care routine/relaxation methods, pt says she watches TV, takes care of her animals and takes hot showers. Discussed with pt identifying times during day to reset and recharge.  Plan: Return again in 4 weeks.  Diagnosis: Mood disorder                                     PTSD  Collaboration of Care: Other none requested  Patient/Guardian was advised Release of Information must be obtained prior to any record release in order to collaborate their care with an outside provider. Patient/Guardian was advised if they have not already done so to contact the registration department to sign all necessary forms in order for [place] to release information regarding their care.   Consent: Patient/Guardian gives verbal consent for treatment and assignment of benefits for services provided during this visit. Patient/Guardian expressed understanding and agreed to proceed.   Suzan Slick, LCSW 05/11/2021

## 2021-06-08 ENCOUNTER — Ambulatory Visit (INDEPENDENT_AMBULATORY_CARE_PROVIDER_SITE_OTHER): Payer: Medicaid Other | Admitting: Clinical

## 2021-06-08 ENCOUNTER — Other Ambulatory Visit: Payer: Self-pay

## 2021-06-08 DIAGNOSIS — F4310 Post-traumatic stress disorder, unspecified: Secondary | ICD-10-CM | POA: Diagnosis not present

## 2021-06-08 DIAGNOSIS — F53 Postpartum depression: Secondary | ICD-10-CM | POA: Diagnosis not present

## 2021-06-08 DIAGNOSIS — F39 Unspecified mood [affective] disorder: Secondary | ICD-10-CM

## 2021-06-08 NOTE — Progress Notes (Signed)
? ?  THERAPIST PROGRESS NOTE ? ?Session Time: 10am ? ?Participation Level: Active ? ?Behavioral Response: Alerttired ? ?Type of Therapy: Individual Therapy ? ?Treatment Goals addressed:  ? ?ProgressTowards Goals: Progressing ? ?Interventions: Supportive ?Virtual Visit via Telephone Note ? ?I connected with Cleora Fleet on 06/08/21 at 10:00 AM EDT by telephone and verified that I am speaking with the correct person using two identifiers. ? ?Location: ?Patient: home ?Provider: office ?  ?I discussed the limitations, risks, security and privacy concerns of performing an evaluation and management service by telephone and the availability of in person appointments. I also discussed with the patient that there may be a patient responsible charge related to this service. The patient expressed understanding and agreed to proceed. ?  ?I discussed the assessment and treatment plan with the patient. The patient was provided an opportunity to ask questions and all were answered. The patient agreed with the plan and demonstrated an understanding of the instructions. ?  ?The patient was advised to call back or seek an in-person evaluation if the symptoms worsen or if the condition fails to improve as anticipated. ? ?I provided 40 minutes of non-face-to-face time during this encounter.  ?Summary: Pt reports she has been having "depression spells" and endorses difficulty getting out of bed, crying spells, sadness and irritability. Pt denies any psychosis or suicidal or homicidal ideation. Pt states she has been prescribed seroquel and lamictal for mood management. Per pt report, the seroquel makes her "sleepy and zombie like" Pt says she stopped taking it in February 2023. Pt states the lamictal causes increased depressive sxs and says she takes it intermittently, last taken 3/22. Pt reports the medications make it difficult for her to be attentive to her child's needs.  Actively listened and supported pt as she raised questions  about the side effects of medication. Pt says her next medication mgmt visit is on 4/6; encouraged to write down all questions, side effects and concerns prior to visit. Pt encouraged to monitor for behavioral patterns or changes in mood. ?Suicidal/Homicidal: Pt denies SI/HI no plan, intent or attempt to harm self or others reported.  ? ? ?Plan: Return again in 3 weeks. ? ?Diagnosis: Mood disorder ?                                    PTSD ?                                    Post partum depression ? ?Collaboration of Care: Other none requested ? ?Patient/Guardian was advised Release of Information must be obtained prior to any record release in order to collaborate their care with an outside provider. Patient/Guardian was advised if they have not already done so to contact the registration department to sign all necessary forms in order for [place] to release information regarding their care.  ? ?Consent: Patient/Guardian gives verbal consent for treatment and assignment of benefits for services provided during this visit. Patient/Guardian expressed understanding and agreed to proceed.  ? ?Mariadelcarmen Corella Philip Aspen, LCSW ?06/08/2021 ? ?

## 2021-06-18 ENCOUNTER — Encounter (HOSPITAL_COMMUNITY): Payer: Self-pay | Admitting: Psychiatry

## 2021-06-18 ENCOUNTER — Telehealth (HOSPITAL_BASED_OUTPATIENT_CLINIC_OR_DEPARTMENT_OTHER): Payer: Medicaid Other | Admitting: Psychiatry

## 2021-06-18 VITALS — Wt 250.0 lb

## 2021-06-18 DIAGNOSIS — F3181 Bipolar II disorder: Secondary | ICD-10-CM

## 2021-06-18 DIAGNOSIS — F4310 Post-traumatic stress disorder, unspecified: Secondary | ICD-10-CM

## 2021-06-18 MED ORDER — ARIPIPRAZOLE 2 MG PO TABS: 2.0000 mg | ORAL_TABLET | Freq: Every day | ORAL | 0 refills | Status: DC

## 2021-06-18 NOTE — Addendum Note (Signed)
Addended by: Kathryne Sharper T on: 06/18/2021 02:03 PM ? ? Modules accepted: Orders ? ?
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
## 2021-06-19 ENCOUNTER — Telehealth (HOSPITAL_COMMUNITY): Payer: Self-pay

## 2021-06-19 NOTE — Telephone Encounter (Signed)
[employer] TR[employer]S PRESCRIPTION COVERAGE ?APPROVED  ARIPIPRAZOLE 2MG  TABLET ?PA # ?EFFECTIVE 06/19/2021 TO 06/14/2022 ? ?S/W [facility] ?REFERENCE ID # [id number] ?NOTIFIED PHARMACY. PT IS SET UP FOR TEXT MESSAGING SO WILL BE NOTIFIED WHEN MEDICATION IS READY FOR P/U ?

## 2021-07-02 ENCOUNTER — Ambulatory Visit (INDEPENDENT_AMBULATORY_CARE_PROVIDER_SITE_OTHER): Payer: Medicaid Other | Admitting: Clinical

## 2021-07-02 DIAGNOSIS — F4310 Post-traumatic stress disorder, unspecified: Secondary | ICD-10-CM | POA: Diagnosis not present

## 2021-07-02 DIAGNOSIS — F39 Unspecified mood [affective] disorder: Secondary | ICD-10-CM | POA: Diagnosis not present

## 2021-07-02 NOTE — Progress Notes (Signed)
? ?  THERAPIST PROGRESS NOTE ? ?Session Time: 11am ? ?Participation Level: Active ? ?Behavioral Response: Alertpleasant ? ?Type of Therapy: Individual Therapy ? ?Treatment Goals addressed: healthy coping ? ?ProgressTowards Goals: Progressing ? ?Interventions: Supportive ?Virtual Visit via Telephone Note ? ?I connected with Cleora Fleet on 07/02/21 at 11:00 AM EDT by telephone and verified that I am speaking with the correct person using two identifiers. ? ?Location: ?Patient: home ?Provider: office ?  ?I discussed the limitations, risks, security and privacy concerns of performing an evaluation and management service by telephone and the availability of in person appointments. I also discussed with the patient that there may be a patient responsible charge related to this service. The patient expressed understanding and agreed to proceed. ?  ?I discussed the assessment and treatment plan with the patient. The patient was provided an opportunity to ask questions and all were answered. The patient agreed with the plan and demonstrated an understanding of the instructions. ?  ?The patient was advised to call back or seek an in-person evaluation if the symptoms worsen or if the condition fails to improve as anticipated. ? ?I provided 20 minutes of non-face-to-face time during this encounter.  ? ?Summary: Last session completed; referral processed discussed and questions answered. Writer provided pt with list of community mental health agencies, encouraged her to check psychology today website and insurance carrier for additional resources. Assessed for changes in mood, behavior and daily functioning. Pt denies SI/HI or AH/VH. Pt says she is adjusting to motherhood and says son is three months old. Pt states receiving support from child's father and her mother.  ? ?Diagnosis: Mood disorder ?                                    PTSD ?                                    ? ?Collaboration of Care: Other referrals for community  mental health providers provided. ? ?Patient/Guardian was advised Release of Information must be obtained prior to any record release in order to collaborate their care with an outside provider. Patient/Guardian was advised if they have not already done so to contact the registration department to sign all necessary forms in order for [place] to release information regarding their care.  ? ?Consent: Patient/Guardian gives verbal consent for treatment and assignment of benefits for services provided during this visit. Patient/Guardian expressed understanding and agreed to proceed.  ? ?Melany Wiesman Philip Aspen, LCSW ?07/02/2021 ? ?

## 2021-07-16 ENCOUNTER — Telehealth (HOSPITAL_BASED_OUTPATIENT_CLINIC_OR_DEPARTMENT_OTHER): Payer: Medicaid Other | Admitting: Psychiatry

## 2021-07-16 ENCOUNTER — Encounter (HOSPITAL_COMMUNITY): Payer: Self-pay | Admitting: Psychiatry

## 2021-07-16 DIAGNOSIS — F3181 Bipolar II disorder: Secondary | ICD-10-CM | POA: Diagnosis not present

## 2021-07-16 DIAGNOSIS — F4310 Post-traumatic stress disorder, unspecified: Secondary | ICD-10-CM | POA: Diagnosis not present

## 2021-07-16 MED ORDER — ARIPIPRAZOLE 2 MG PO TABS: 2.0000 mg | ORAL_TABLET | Freq: Every day | ORAL | 2 refills | Status: DC
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
## 2021-08-11 ENCOUNTER — Encounter (HOSPITAL_COMMUNITY): Payer: Self-pay | Admitting: *Deleted

## 2021-10-16 ENCOUNTER — Telehealth (HOSPITAL_BASED_OUTPATIENT_CLINIC_OR_DEPARTMENT_OTHER): Payer: Medicaid Other | Admitting: Psychiatry

## 2021-10-16 ENCOUNTER — Encounter (HOSPITAL_COMMUNITY): Payer: Self-pay | Admitting: Psychiatry

## 2021-10-16 VITALS — Wt 250.0 lb

## 2021-10-16 DIAGNOSIS — F4310 Post-traumatic stress disorder, unspecified: Secondary | ICD-10-CM

## 2021-10-16 DIAGNOSIS — F53 Postpartum depression: Secondary | ICD-10-CM | POA: Diagnosis not present

## 2021-10-16 DIAGNOSIS — F3181 Bipolar II disorder: Secondary | ICD-10-CM

## 2021-10-16 MED ORDER — ARIPIPRAZOLE 5 MG PO TABS: 30.0000 mg | ORAL_TABLET | Freq: Every day | ORAL | 0 refills | Status: DC
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
## 2021-11-13 ENCOUNTER — Encounter (HOSPITAL_COMMUNITY): Payer: Self-pay | Admitting: Psychiatry

## 2021-11-13 ENCOUNTER — Telehealth (HOSPITAL_BASED_OUTPATIENT_CLINIC_OR_DEPARTMENT_OTHER): Payer: Medicaid Other | Admitting: Psychiatry

## 2021-11-13 VITALS — Wt 250.0 lb

## 2021-11-13 DIAGNOSIS — F53 Postpartum depression: Secondary | ICD-10-CM | POA: Diagnosis not present

## 2021-11-13 DIAGNOSIS — F4310 Post-traumatic stress disorder, unspecified: Secondary | ICD-10-CM

## 2021-11-13 DIAGNOSIS — F3181 Bipolar II disorder: Secondary | ICD-10-CM

## 2021-11-13 MED ORDER — QUETIAPINE FUMARATE 25 MG PO TABS: 25.0000 mg | ORAL_TABLET | Freq: Every day | ORAL | 0 refills | Status: DC

## 2021-11-13 MED ORDER — FLUOXETINE HCL 10 MG PO CAPS: 10.0000 mg | ORAL_CAPSULE | Freq: Every day | ORAL | 0 refills | Status: DC
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
## 2021-11-25 ENCOUNTER — Ambulatory Visit (INDEPENDENT_AMBULATORY_CARE_PROVIDER_SITE_OTHER): Payer: Medicaid Other | Admitting: Licensed Clinical Social Worker

## 2021-11-25 DIAGNOSIS — F411 Generalized anxiety disorder: Secondary | ICD-10-CM | POA: Diagnosis not present

## 2021-11-25 DIAGNOSIS — F3181 Bipolar II disorder: Secondary | ICD-10-CM | POA: Diagnosis not present

## 2021-11-25 DIAGNOSIS — F53 Postpartum depression: Secondary | ICD-10-CM | POA: Diagnosis not present

## 2021-11-27 ENCOUNTER — Encounter (HOSPITAL_COMMUNITY): Payer: Self-pay

## 2021-11-27 NOTE — Plan of Care (Signed)
  Problem: Mood disorder Goal:  Decrease depressive symptoms and improve levels of effective functioning-pt reports a decrease in overall depression symptoms 3 out of 5 sessions documented.  Outcome: Initial Goal: Reduce overall frequency, intensity, and duration of the anxiety so that daily functioning is not impaired per pt self report 3 out of 5 sessions documented.   Outcome: Initial   Developed/revised tx plan based on pt self reported input. Pt verbally agrees with treatment plan at time of session and consents to sign digitally.
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
## 2021-12-08 ENCOUNTER — Telehealth (HOSPITAL_COMMUNITY): Payer: Self-pay | Admitting: *Deleted

## 2021-12-08 NOTE — Telephone Encounter (Signed)
I will let her know. Thanks.

## 2021-12-08 NOTE — Telephone Encounter (Signed)
I know her mother helping her but she never discussed about FMLA to sign. She needs to discussed before we sign papers.

## 2021-12-08 NOTE — Telephone Encounter (Signed)
Pt's mother has dropped off Adult Child With A Disability-FMLA Certification paperwork and called today to ask if it is completed. Forms show mother is asking for intermittent FMLA from 11/13/21 through 04/23/22. I do not see anything in your notes approving this. If this is ok to complete?

## 2021-12-09 ENCOUNTER — Telehealth (HOSPITAL_COMMUNITY): Payer: Self-pay | Admitting: *Deleted

## 2021-12-09 NOTE — Telephone Encounter (Signed)
Writer LVM for pt's mother, Tina Jones, regarding her call requesting intermittent FMLA for herself Tina Jones). Mother advised that this may be discussed during pt's next appointment on 12/11/21 0840.

## 2021-12-11 ENCOUNTER — Telehealth (HOSPITAL_COMMUNITY): Payer: Medicaid Other | Admitting: Psychiatry

## 2021-12-14 ENCOUNTER — Telehealth (HOSPITAL_BASED_OUTPATIENT_CLINIC_OR_DEPARTMENT_OTHER): Payer: Medicaid Other | Admitting: Psychiatry

## 2021-12-14 ENCOUNTER — Encounter (HOSPITAL_COMMUNITY): Payer: Self-pay | Admitting: Psychiatry

## 2021-12-14 DIAGNOSIS — F4310 Post-traumatic stress disorder, unspecified: Secondary | ICD-10-CM

## 2021-12-14 DIAGNOSIS — F53 Postpartum depression: Secondary | ICD-10-CM | POA: Diagnosis not present

## 2021-12-14 DIAGNOSIS — F3181 Bipolar II disorder: Secondary | ICD-10-CM | POA: Diagnosis not present

## 2021-12-14 MED ORDER — FLUOXETINE HCL 10 MG PO CAPS: 10.0000 mg | ORAL_CAPSULE | Freq: Every day | ORAL | 2 refills | Status: DC

## 2021-12-14 MED ORDER — QUETIAPINE FUMARATE 25 MG PO TABS: 25.0000 mg | ORAL_TABLET | Freq: Every day | ORAL | 0 refills | Status: DC
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
## 2021-12-30 ENCOUNTER — Telehealth (HOSPITAL_COMMUNITY): Payer: Self-pay | Admitting: *Deleted

## 2021-12-30 NOTE — Telephone Encounter (Signed)
Pt's mother, Lucita Ferrara, called regarding her FMLA, asking that it be resent to [employer] @ [phone]. Forms updated and faxed to [place]. Writer spoke with Lucita Ferrara who says she will call [employer] to verify receipt and to make sure nothing else is needed.

## 2022-01-06 ENCOUNTER — Other Ambulatory Visit (HOSPITAL_COMMUNITY): Payer: Self-pay | Admitting: Psychiatry

## 2022-01-06 ENCOUNTER — Ambulatory Visit (INDEPENDENT_AMBULATORY_CARE_PROVIDER_SITE_OTHER): Payer: Medicaid Other | Admitting: Licensed Clinical Social Worker

## 2022-01-06 DIAGNOSIS — F3181 Bipolar II disorder: Secondary | ICD-10-CM | POA: Diagnosis not present

## 2022-01-06 DIAGNOSIS — F4310 Post-traumatic stress disorder, unspecified: Secondary | ICD-10-CM

## 2022-01-06 NOTE — Progress Notes (Signed)
   THERAPIST PROGRESS NOTE  Session Time: [place] in office visit for patient and LCSW clinician  Participation Level: Active  Behavioral Response: Neat and Well GroomedAlertAnxious  Type of Therapy: Individual Therapy  Treatment Goals addressed: Problem: Mood disorder Goal:  Decrease depressive symptoms and improve levels of effective functioning-pt reports a decrease in overall depression symptoms 3 out of 5 sessions documented.  Outcome: Progressing Goal: Reduce overall frequency, intensity, and duration of the anxiety so that daily functioning is not impaired per pt self report 3 out of 5 sessions documented.   Outcome: Progressing Intervention: Encourage self-care activities Note: Reviewed  Intervention: REVIEW PLEASE SKILLS (TREAT PHYSICAL ILLNESS, BALANCE EATING, AVOID MOOD-ALTERING SUBSTANCES, BALANCE SLEEP AND GET EXERCISE) WITH Delphina "[name]" Note: Reviewed     ProgressTowards Goals: Progressing  Interventions: CBT, DBT, Solution Focused, and Supportive  Summary: LATREECE MOCHIZUKI is a 21 y.o. female who presents with improving symptoms related to bipolar disorder diagnosis. Pt reports that she is compliant with her medication, her mood has been more stable and that she is managing situational stressors better.   Allowed pt to explore and express thoughts and feelings associated with recent life situations and external stressors. Patient reports that she is continuing to work at [employer] in this enjoying this time of year. Patient reports that her family is supportive and it's helping manage the baby while she's working. Patient reports that the baby's father was recently laid off work, but it is not impacting them financially at this time. Patient reports that overall relationships with him, with her parents, and with other family members remains good. Patient reports that she is being intentional about engaging in activities that are  getting her out of the house, like going to the park, and going to the fair.  Allowed patient to identify people, situations, and thoughts that trigger strong emotions. Discuss different techniques of emotion regulation. Patient reports that she feels that she has improved in regulating her emotions recently.  Patient denies any suicidal or homicidal ideation. Patient reports that she does still have some perceptual disturbances, but that is part of her baseline. Patient reports that she is compliant with continued psychiatric medication management appointments. Encouraged patient to continue.  Continued recommendations are as follows: self care behaviors, positive social engagements, focusing on overall work/home/life balance, and focusing on positive physical and emotional wellness.   Suicidal/Homicidal: No  Therapist Response: Pt is continuing to apply interventions learned in session into daily life situations. Pt is currently on track to meet goals utilizing interventions mentioned above. Personal growth and progress noted. Treatment to continue as indicated.   Plan: Return again in 4 weeks.  Diagnosis:  Encounter Diagnosis  Name Primary?   Bipolar 2 disorder, major depressive episode ([place]) Yes    Collaboration of Care: Other pt encouraged to continue care with psychiatrist of record, Dr. Berniece Andreas  Patient/Guardian was advised Release of Information must be obtained prior to any record release in order to collaborate their care with an outside provider. Patient/Guardian was advised if they have not already done so to contact the registration department to sign all necessary forms in order for [place] to release information regarding their care.   Consent: Patient/Guardian gives verbal consent for treatment and assignment of benefits for services provided during this visit. Patient/Guardian expressed understanding and agreed to proceed.   [place], LCSW 01/06/2022

## 2022-01-08 NOTE — Plan of Care (Signed)
  Problem: Mood disorder Goal:  Decrease depressive symptoms and improve levels of effective functioning-pt reports a decrease in overall depression symptoms 3 out of 5 sessions documented.  Outcome: Progressing Goal: Reduce overall frequency, intensity, and duration of the anxiety so that daily functioning is not impaired per pt self report 3 out of 5 sessions documented.   Outcome: Progressing Intervention: Encourage self-care activities Note: Reviewed  Intervention: REVIEW PLEASE SKILLS (TREAT PHYSICAL ILLNESS, BALANCE EATING, AVOID MOOD-ALTERING SUBSTANCES, BALANCE SLEEP AND GET EXERCISE) WITH Danae Chen "[name]" Note: Reviewed

## 2022-02-25 ENCOUNTER — Ambulatory Visit (INDEPENDENT_AMBULATORY_CARE_PROVIDER_SITE_OTHER): Payer: Medicaid Other | Admitting: Licensed Clinical Social Worker

## 2022-02-25 DIAGNOSIS — F3181 Bipolar II disorder: Secondary | ICD-10-CM

## 2022-02-25 DIAGNOSIS — F411 Generalized anxiety disorder: Secondary | ICD-10-CM

## 2022-02-25 NOTE — Progress Notes (Signed)
   THERAPIST PROGRESS NOTE  Session Time: 11a-12p  Behavioral [facility] in office visit for patient and LCSW clinician  Participation Level: Active  Behavioral Response: Neat and Well GroomedAlertAnxious  Type of Therapy: Individual Therapy  Treatment Goals addressed: Decrease depressive symptoms and improve levels of effective functioning-pt reports a decrease in overall depression symptoms 3 out of 5 sessions documented   ProgressTowards Goals: Progressing  Interventions: CBT, DBT, Solution Focused, and Supportive  Summary: Tina Jones is a 21 y.o. female who presents with improving symptoms related to bipolar disorder diagnosis. Pt reports that she is compliant with her medication, her mood has been more stable and that she is managing situational stressors better.   Allowed pt to explore and express thoughts and feelings associated with recent life situations and external stressors.Facial reports that she's continuing to experience stress associated with her father-in-law being hospitalized, and her boyfriend is spending a lot of time being his father's caregiver. Facial reports that her sonny's birthday is January 5th, and patient has some stress associated with his first birthday. Patient reports that her overall mood is improved, and she is getting good quality and quantity of sleep. Patient reports she has some depression associated with Christmas--mostly triggered over increasing household tasks that patient has to do because of father-in-law being in the hospital.  Patient reports that her boyfriend did get another job, so she is very happy about this and not as stressed about finances. Allowed patient to explore positive behaviors and positive ways of managing her stress and anxiety.  Patient denies any suicidal ideation, homicidal ideation, or AVH  Continued recommendations are as follows: self care behaviors, positive social engagements, focusing on  overall work/home/life balance, and focusing on positive physical and emotional wellness.   Suicidal/Homicidal: No  Therapist Response: Pt is continuing to apply interventions learned in session into daily life situations. Pt is currently on track to meet goals utilizing interventions mentioned above. Personal growth and progress noted. Treatment to continue as indicated.   Plan: Return again in 4 weeks.  Diagnosis:  Encounter Diagnoses  Name Primary?   Bipolar 2 disorder, major depressive episode (HCC) Yes   Generalized anxiety disorder     Collaboration of Care: Other pt encouraged to continue care with psychiatrist of record, Dr. Kathryne Sharper  Patient/Guardian was advised Release of Information must be obtained prior to any record release in order to collaborate their care with an outside provider. Patient/Guardian was advised if they have not already done so to contact the registration department to sign all necessary forms in order for [place] to release information regarding their care.   Consent: Patient/Guardian gives verbal consent for treatment and assignment of benefits for services provided during this visit. Patient/Guardian expressed understanding and agreed to proceed.   Ernest Haber Amauri Keefe, LCSW 02/25/2022

## 2022-02-27 ENCOUNTER — Ambulatory Visit
Admission: RE | Admit: 2022-02-27 | Discharge: 2022-02-27 | Disposition: A | Discharge status: Home / Self Care | Payer: Medicaid Other | Source: Ambulatory Visit | Attending: Internal Medicine | Admitting: Internal Medicine

## 2022-02-27 VITALS — BP 135/83 | HR 67 | Temp 98.5°F | Resp 18

## 2022-02-27 DIAGNOSIS — Z3202 Encounter for pregnancy test, result negative: Secondary | ICD-10-CM

## 2022-02-27 LAB — POCT URINE PREGNANCY: Preg Test, Ur: NEGATIVE

## 2022-02-27 NOTE — ED Triage Notes (Signed)
Pt presents to [facility] with co of pregnancy test, pt reports she hasn't been taking bc and she has one positive and one neg preg test at home.

## 2022-02-27 NOTE — Discharge Instructions (Signed)
Your blood work is pending for pregnancy testing.  We will call if it is abnormal.  Please follow-up with gynecologist if you continue to miss a period.
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
## 2022-03-02 LAB — BETA HCG QUANT (REF LAB): hCG Quant: <1 m[IU]/mL

## 2022-03-16 ENCOUNTER — Encounter (HOSPITAL_COMMUNITY): Payer: Self-pay | Admitting: Psychiatry

## 2022-03-16 ENCOUNTER — Telehealth (HOSPITAL_BASED_OUTPATIENT_CLINIC_OR_DEPARTMENT_OTHER): Payer: Medicaid Other | Admitting: Psychiatry

## 2022-03-16 DIAGNOSIS — F4310 Post-traumatic stress disorder, unspecified: Secondary | ICD-10-CM

## 2022-03-16 DIAGNOSIS — F53 Postpartum depression: Secondary | ICD-10-CM

## 2022-03-16 DIAGNOSIS — F3181 Bipolar II disorder: Secondary | ICD-10-CM | POA: Diagnosis not present

## 2022-03-16 MED ORDER — FLUOXETINE HCL 10 MG PO CAPS: 10.0000 mg | ORAL_CAPSULE | Freq: Every day | ORAL | 2 refills | Status: DC

## 2022-03-16 MED ORDER — QUETIAPINE FUMARATE 25 MG PO TABS: 25.0000 mg | ORAL_TABLET | Freq: Every day | ORAL | 0 refills | Status: DC
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
## 2022-04-13 ENCOUNTER — Other Ambulatory Visit (HOSPITAL_COMMUNITY): Payer: Self-pay | Admitting: Psychiatry

## 2022-04-13 DIAGNOSIS — F3181 Bipolar II disorder: Secondary | ICD-10-CM

## 2022-04-13 DIAGNOSIS — F4310 Post-traumatic stress disorder, unspecified: Secondary | ICD-10-CM

## 2022-04-14 ENCOUNTER — Ambulatory Visit (HOSPITAL_COMMUNITY): Payer: Medicaid Other | Admitting: Licensed Clinical Social Worker

## 2022-04-26 ENCOUNTER — Ambulatory Visit
Admission: RE | Admit: 2022-04-26 | Discharge: 2022-04-26 | Disposition: A | Discharge status: Home / Self Care | Payer: Managed Care, Other (non HMO) | Source: Ambulatory Visit | Attending: Family Medicine | Admitting: Family Medicine

## 2022-04-26 ENCOUNTER — Other Ambulatory Visit: Payer: Self-pay

## 2022-04-26 VITALS — BP 126/85 | HR 75 | Temp 98.5°F | Resp 16

## 2022-04-26 DIAGNOSIS — N912 Amenorrhea, unspecified: Secondary | ICD-10-CM | POA: Diagnosis not present

## 2022-04-26 LAB — POCT URINE PREGNANCY: Preg Test, Ur: NEGATIVE
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
## 2022-04-26 NOTE — ED Triage Notes (Signed)
Pt here for pregnancy test states 2 preg(+) at home needs a medical document showing pregnancy so can see ob/gyn.

## 2022-04-26 NOTE — Discharge Instructions (Signed)
You were seen today for positive pregnancy test at home and missed period.  Urine pregnancy test here was negative.  Therefor I have ordered blood work and this will likely be resulted tomorrow.  You will see this on mychart when results are available.

## 2022-04-30 ENCOUNTER — Ambulatory Visit (HOSPITAL_COMMUNITY): Payer: Medicaid Other | Admitting: Licensed Clinical Social Worker

## 2022-05-01 LAB — SPECIMEN STATUS REPORT

## 2022-05-01 LAB — BETA HCG QUANT (REF LAB): hCG Quant: 75 m[IU]/mL

## 2022-05-11 ENCOUNTER — Ambulatory Visit (INDEPENDENT_AMBULATORY_CARE_PROVIDER_SITE_OTHER): Payer: 59 | Admitting: Licensed Clinical Social Worker

## 2022-05-11 DIAGNOSIS — F4310 Post-traumatic stress disorder, unspecified: Secondary | ICD-10-CM

## 2022-05-11 DIAGNOSIS — F3181 Bipolar II disorder: Secondary | ICD-10-CM | POA: Diagnosis not present

## 2022-05-11 NOTE — Progress Notes (Signed)
   THERAPIST PROGRESS NOTE  Session Time: [place] in office visit for patient and LCSW clinician  Participation Level: Active  Behavioral Response: Neat and Well GroomedAlertAnxious  Type of Therapy: Individual Therapy  Treatment Goals addressed: Decrease depressive symptoms and improve levels of effective functioning-pt reports a decrease in overall depression symptoms 3 out of 5 sessions documented   ProgressTowards Goals: Progressing  Interventions: CBT, DBT, Solution Focused, and Supportive  Summary: BANDI RENSLOW is a 22 y.o. female who presents with improving symptoms related to bipolar disorder diagnosis. Pt states that she recently stopped her psychiatric medication due to her early pregnancy--pt states she will discuss this with her psychiatrist at her next appointment.   Allowed pt to explore and express thoughts and feelings associated with recent life situations and external stressors.Pt is excited about early news that she is pregnant (6weeks). Pt has OB appointment in the next couple of weeks.   Pt grieving the loss of her uncle recently--pt did inherit his car, which pt states she is excited about because it is a bigger vehicle that would allow her to use two car seats. Pt also states that she recently became engaged with her son's father. "We are so excited". Pt reports that her family is supportive.   Pt states that her overall mood has been positive and that she has not had any episodes of panic or angry outbursts. Pt denies any suicidal ideation.   Assisted pt with identifying anxiety/stress triggers and allowed pt to explore/identify current coping strategies that are working well for her. Encouraged pt to continue focus on managing stress and self care.   Encouraged pt to continue seeing psychiatrist throughout pregnancy even if no medications are being managed.   Continued recommendations are as follows: self care  behaviors, positive social engagements, focusing on overall work/home/life balance, and focusing on positive physical and emotional wellness.   Suicidal/Homicidal: No  Therapist Response: Pt is continuing to apply interventions learned in session into daily life situations. Pt is currently on track to meet goals utilizing interventions mentioned above. Personal growth and progress noted. Treatment to continue as indicated.   Plan: Return again in 4 weeks.  Diagnosis:  Encounter Diagnoses  Name Primary?   Bipolar 2 disorder, major depressive episode (HCC) Yes   PTSD (post-traumatic stress disorder)     Collaboration of Care: Other pt encouraged to continue care with psychiatrist of record, Dr. Berniece Andreas  Patient/Guardian was advised Release of Information must be obtained prior to any record release in order to collaborate their care with an outside provider. Patient/Guardian was advised if they have not already done so to contact the registration department to sign all necessary forms in order for [place] to release information regarding their care.   Consent: Patient/Guardian gives verbal consent for treatment and assignment of benefits for services provided during this visit. Patient/Guardian expressed understanding and agreed to proceed.   [place], LCSW 05/11/2022

## 2022-05-20 ENCOUNTER — Telehealth (HOSPITAL_COMMUNITY): Payer: Self-pay | Admitting: *Deleted

## 2022-05-20 NOTE — Telephone Encounter (Signed)
Patient need to stop psychotropic medication.

## 2022-05-20 NOTE — Telephone Encounter (Signed)
Pt's mother, Lucita Ferrara, left VM asking for new intermittent FMLA due to pt being pregnant. She will drop forms off at this office. FYI.

## 2022-05-21 NOTE — Telephone Encounter (Signed)
Per mother pt has stopped meds but she will make sure to check with pt and call me back. Earlier appointment with you advised as well. Mother is going to have pt call for appointment she says. Next scheduled appointment is on 06/15/22.

## 2022-06-01 DIAGNOSIS — O99210 Obesity complicating pregnancy, unspecified trimester: Secondary | ICD-10-CM | POA: Insufficient documentation

## 2022-06-04 ENCOUNTER — Telehealth (HOSPITAL_COMMUNITY): Payer: Self-pay | Admitting: *Deleted

## 2022-06-04 NOTE — Telephone Encounter (Signed)
Writer spoke with pt mother, Lucita Ferrara, to advise that FMLA paperwork has been completed and faxed to [employer] and Medical Leave Management @ [phone].

## 2022-06-15 ENCOUNTER — Telehealth (HOSPITAL_BASED_OUTPATIENT_CLINIC_OR_DEPARTMENT_OTHER): Payer: 59 | Admitting: Psychiatry

## 2022-06-15 ENCOUNTER — Encounter (HOSPITAL_COMMUNITY): Payer: Self-pay | Admitting: Psychiatry

## 2022-06-15 ENCOUNTER — Telehealth (HOSPITAL_COMMUNITY): Payer: 59 | Admitting: Psychiatry

## 2022-06-15 VITALS — Wt 260.0 lb

## 2022-06-15 DIAGNOSIS — F4310 Post-traumatic stress disorder, unspecified: Secondary | ICD-10-CM | POA: Diagnosis not present

## 2022-06-15 DIAGNOSIS — F3181 Bipolar II disorder: Secondary | ICD-10-CM
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
## 2022-06-29 ENCOUNTER — Ambulatory Visit (HOSPITAL_COMMUNITY): Payer: 59 | Admitting: Licensed Clinical Social Worker

## 2022-07-25 ENCOUNTER — Inpatient Hospital Stay (HOSPITAL_COMMUNITY)
Admission: AD | Admit: 2022-07-25 | Discharge: 2022-07-26 | Disposition: A | Discharge status: Home / Self Care | Payer: Managed Care, Other (non HMO) | Attending: Obstetrics and Gynecology | Admitting: Obstetrics and Gynecology

## 2022-07-25 ENCOUNTER — Encounter (HOSPITAL_COMMUNITY): Payer: Self-pay

## 2022-07-25 DIAGNOSIS — O211 Hyperemesis gravidarum with metabolic disturbance: Secondary | ICD-10-CM | POA: Diagnosis not present

## 2022-07-25 DIAGNOSIS — O210 Mild hyperemesis gravidarum: Secondary | ICD-10-CM | POA: Diagnosis present

## 2022-07-25 DIAGNOSIS — O219 Vomiting of pregnancy, unspecified: Secondary | ICD-10-CM | POA: Diagnosis not present

## 2022-07-25 DIAGNOSIS — Z3A17 17 weeks gestation of pregnancy: Secondary | ICD-10-CM | POA: Diagnosis not present

## 2022-07-25 LAB — POCT PREGNANCY, URINE: Preg Test, Ur: POSITIVE — AB

## 2022-07-25 LAB — URINALYSIS, MICROSCOPIC (REFLEX)

## 2022-07-25 LAB — URINALYSIS, ROUTINE W REFLEX MICROSCOPIC
Bilirubin Urine: NEGATIVE
Glucose, UA: NEGATIVE mg/dL
Ketones, ur: >80 mg/dL — AB
Nitrite: NEGATIVE
Protein, ur: 100 mg/dL — AB
Specific Gravity, Urine: >1.03 — ABNORMAL HIGH (ref 1.005–1.030)
pH: 6 (ref 5.0–8.0)

## 2022-07-25 MED ORDER — SCOPOLAMINE 1 MG/3DAYS TD PT72
1.0000 | MEDICATED_PATCH | TRANSDERMAL | Status: DC
  Administered 2022-07-26: 1.5 mg via TRANSDERMAL
  Filled 2022-07-25: qty 1

## 2022-07-25 MED ORDER — METOCLOPRAMIDE HCL 5 MG/ML IJ SOLN: 10.0000 mg | Freq: Once | INTRAMUSCULAR | Status: DC

## 2022-07-25 MED ORDER — ONDANSETRON HCL 4 MG/2ML IJ SOLN: 4.0000 mg | Freq: Once | INTRAMUSCULAR | Status: DC

## 2022-07-25 MED ORDER — LACTATED RINGERS IV BOLUS: 1000.0000 mL | Freq: Once | INTRAVENOUS | Status: DC

## 2022-07-25 NOTE — MAU Note (Signed)
.  Tina Jones is a 22 y.o. at Unknown here in MAU reporting: newly onset N/V with 2 episodes of emesis since 2000 today that was projectile, shallow breathing with standing and laying down before and after vomiting, ABD pain with vomiting, mid sternal chest pain that feelings like heaviness, chills, and states she had a temp of 99.3 prior to leaving to the hospital. Pt denies taking any medications for nausea or pain. Pt states she had ate a lot today while she was at a Mother's day family cookout and gathering. Pt denies VB or LOF.   PNC [facility] in [place]  Onset of complaint: 2000 Pain score: 8/10 ABD, 5/10 chest  Vitals:   07/25/22 2313  BP: 125/68  Pulse: (!) 108  Resp: 16  Temp: 98 F (36.7 C)  SpO2: 100%     FHT:155 Lab orders placed from triage:  UA

## 2022-07-26 DIAGNOSIS — Z3A17 17 weeks gestation of pregnancy: Secondary | ICD-10-CM | POA: Diagnosis not present

## 2022-07-26 DIAGNOSIS — O219 Vomiting of pregnancy, unspecified: Secondary | ICD-10-CM

## 2022-07-26 DIAGNOSIS — O211 Hyperemesis gravidarum with metabolic disturbance: Secondary | ICD-10-CM | POA: Diagnosis not present

## 2022-07-26 MED ORDER — METOCLOPRAMIDE HCL 10 MG PO TABS
10.0000 mg | ORAL_TABLET | Freq: Once | ORAL | Status: AC
  Administered 2022-07-26: 10 mg via ORAL
  Filled 2022-07-26: qty 1

## 2022-07-26 MED ORDER — ONDANSETRON 4 MG PO TBDP
8.0000 mg | ORAL_TABLET | Freq: Once | ORAL | Status: AC
  Administered 2022-07-26: 8 mg via ORAL
  Filled 2022-07-26: qty 2

## 2022-07-26 MED ORDER — ONDANSETRON 8 MG PO TBDP: 8.0000 mg | ORAL_TABLET | Freq: Three times a day (TID) | ORAL | 2 refills | Status: DC | PRN

## 2022-07-26 MED ORDER — SCOPOLAMINE 1 MG/3DAYS TD PT72: 1.0000 | MEDICATED_PATCH | TRANSDERMAL | 12 refills | Status: AC

## 2022-07-26 MED ORDER — PROMETHAZINE HCL 25 MG PO TABS: 25.0000 mg | ORAL_TABLET | Freq: Four times a day (QID) | ORAL | 0 refills | Status: DC | PRN

## 2022-07-26 NOTE — MAU Provider Note (Signed)
History     [name]: [id number]  Arrival date and time: 07/25/22 2222   Event Date/Time   First Provider Initiated Contact with Patient 07/26/22 0000      Chief Complaint  Patient presents with   Nausea   Emesis   HPI  Tina Jones is a 22 y.o. G2P1001 at [redacted]w[redacted]d who presents for evaluation of nausea and vomiting. Patient reports she ate "a bunch" multiple times today due to Mother's Day events. She reports she had 3 episodes of projectile vomiting this evening. She is reporting still feeling nauseous and generally unwell. She denies any pain.  She denies any vaginal bleeding, discharge, and leaking of fluid. Denies any constipation, diarrhea or any urinary complaints. Reports normal fetal movement.   OB History     Gravida  2   Para  1   Term  1   Preterm      AB      Living  1      SAB      IAB      Ectopic      Multiple      Live Births  1           Past Medical History:  Diagnosis Date   Anxiety    Bipolar disorder (HCC)    Obesity    PTSD (post-traumatic stress disorder)    Vision abnormalities    Glasses, myopia    Past Surgical History:  Procedure Laterality Date   NO PAST SURGERIES      Family History  Problem Relation Age of Onset   Diabetes Maternal Grandmother    Hypertension Maternal Grandmother    Cancer Maternal Grandmother        cervicla cancer   Heart attack Father    Early death Father    Diabetes Maternal Grandfather    Hypertension Maternal Grandfather    Rashes / Skin problems Daughter    Depression Paternal Grandmother    Alcohol abuse Neg Hx    Arthritis Neg Hx    Asthma Neg Hx    Birth defects Neg Hx    COPD Neg Hx    Drug abuse Neg Hx    Hearing loss Neg Hx    Heart disease Neg Hx    Hyperlipidemia Neg Hx    Kidney disease Neg Hx    Learning disabilities Neg Hx    Mental illness Neg Hx    Mental retardation Neg Hx    Miscarriages / Stillbirths Neg Hx    Stroke Neg Hx    Vision loss Neg Hx    Varicose  Veins Neg Hx     Social History   Tobacco Use   Smoking status: Never   Smokeless tobacco: Never  Vaping Use   Vaping Use: Never used  Substance Use Topics   Alcohol use: No   Drug use: No    Allergies:  Allergies  Allergen Reactions   Lamictal [Lamotrigine] Other (See Comments)    Feel emotional and tearful    No medications prior to admission.    Review of Systems  Constitutional: Negative.  Negative for fatigue and fever.  HENT: Negative.    Respiratory: Negative.  Negative for shortness of breath.   Cardiovascular: Negative.  Negative for chest pain.  Gastrointestinal:  Positive for nausea and vomiting. Negative for abdominal pain, constipation and diarrhea.  Genitourinary: Negative.  Negative for dysuria, vaginal bleeding and vaginal discharge.  Neurological: Negative.  Negative for dizziness and headaches.  Physical Exam   Blood pressure 122/71, pulse 82, temperature (!) 96.8 F (36 C), temperature source Oral, resp. rate 17, height 6' (1.829 m), weight 117.8 kg, last menstrual period 02/01/2022, SpO2 100 %.  Patient Vitals for the past 24 hrs:  BP Temp Temp src Pulse Resp SpO2 Height Weight  07/26/22 0049 122/71 (!) 96.8 F (36 C) Oral 82 17 100 % -- --  07/25/22 2313 125/68 98 F (36.7 C) Oral (!) 108 16 100 % 6' (1.829 m) 117.8 kg    Physical Exam Vitals and nursing note reviewed.  Constitutional:      General: She is not in acute distress.    Appearance: She is well-developed.  HENT:     Head: Normocephalic.  Eyes:     Pupils: Pupils are equal, round, and reactive to light.  Cardiovascular:     Rate and Rhythm: Normal rate and regular rhythm.     Heart sounds: Normal heart sounds.  Pulmonary:     Effort: Pulmonary effort is normal. No respiratory distress.     Breath sounds: Normal breath sounds.  Abdominal:     General: Bowel sounds are normal. There is no distension.     Palpations: Abdomen is soft.     Tenderness: There is no abdominal  tenderness.  Skin:    General: Skin is warm and dry.  Neurological:     Mental Status: She is alert and oriented to person, place, and time.  Psychiatric:        Mood and Affect: Mood normal.        Behavior: Behavior normal.        Thought Content: Thought content normal.        Judgment: Judgment normal.     FHT: 155 bpm  MAU Course  Procedures  Results for orders placed or performed during the hospital encounter of 07/25/22 (from the past 24 hour(s))  Urinalysis, Routine w reflex microscopic -Urine, Clean Catch     Status: Abnormal   Collection Time: 07/25/22 10:46 PM  Result Value Ref Range   Color, Urine YELLOW YELLOW   APPearance HAZY (A) CLEAR   Specific Gravity, Urine >1.030 (H) 1.005 - 1.030   pH 6.0 5.0 - 8.0   Glucose, UA NEGATIVE NEGATIVE mg/dL   Hgb urine dipstick TRACE (A) NEGATIVE   Bilirubin Urine NEGATIVE NEGATIVE   Ketones, ur >80 (A) NEGATIVE mg/dL   Protein, ur 161 (A) NEGATIVE mg/dL   Nitrite NEGATIVE NEGATIVE   Leukocytes,Ua SMALL (A) NEGATIVE  Urinalysis, Microscopic (reflex)     Status: Abnormal   Collection Time: 07/25/22 10:46 PM  Result Value Ref Range   RBC / HPF 0-5 0 - 5 RBC/hpf   WBC, UA 21-50 0 - 5 WBC/hpf   Bacteria, UA MANY (A) NONE SEEN   Squamous Epithelial / HPF 21-50 0 - 5 /HPF   Mucus PRESENT    Urine-Other MICROSCOPIC EXAM PERFORMED ON UNCONCENTRATED URINE   Pregnancy, urine POC     Status: Abnormal   Collection Time: 07/25/22 11:03 PM  Result Value Ref Range   Preg Test, Ur POSITIVE (A) NEGATIVE     MDM Labs ordered and reviewed.   UA CNM ordered IV, labs and antiemetics due to dehydration noted on UA. Patient declines IV and labs stating "you always blow my veins and I don't want that done tonight." Requested PO medication  Zofran Reglan  Scop patch  Patient reports she is no longer nauseous and able to tolerate PO  Assessment and Plan   1. Nausea and vomiting during pregnancy   2. [redacted] weeks gestation of pregnancy      -Discharge home in stable condition -Rx for antiemetics sent to pharmacy -Second trimester precautions discussed -Patient advised to follow-up with OB as scheduled for prenatal care -Patient may return to MAU as needed or if her condition were to change or worsen  Rolm Bookbinder, CNM 07/26/2022, 12:00 AM

## 2022-07-26 NOTE — Discharge Instructions (Signed)

## 2022-07-27 DIAGNOSIS — Z8049 Family history of malignant neoplasm of other genital organs: Secondary | ICD-10-CM | POA: Insufficient documentation

## 2022-08-17 ENCOUNTER — Telehealth: Payer: Self-pay | Admitting: Lactation Services

## 2022-08-17 NOTE — Telephone Encounter (Signed)
Received PA for Scopolamine patches. Called Pharmacy and they report she does need a PA completed.   Attempted to complete in Cover my Meds and was instructed to call [facility].   Called and spoke with Alcario Drought and then [place] with with [employer]. Was informed patient does have active Medicaid and that needs to be run as brand.   Called and spoke with Pharmacy and informed them of above information from [employer]. She ran back through as brand and received message that was a cancelled NDC number. Reports [employer] [place] [facility] needs to provide NDC number.   Called [facility] again and spoke with Mya. Reviewed what was communicated by [facility]. Mya gave me an NDC numbers of [id number], [id number], [phone].   [facility] and gave them NDC numbers above. Spoke with Pharmacy and was did go through, they will have to order and patient will receive a text when it is ready.

## 2022-10-12 ENCOUNTER — Encounter (HOSPITAL_COMMUNITY): Payer: Self-pay

## 2022-10-12 ENCOUNTER — Telehealth (HOSPITAL_BASED_OUTPATIENT_CLINIC_OR_DEPARTMENT_OTHER): Payer: Self-pay | Admitting: Psychiatry

## 2022-10-12 DIAGNOSIS — Z91199 Patient's noncompliance with other medical treatment and regimen due to unspecified reason: Secondary | ICD-10-CM

## 2022-10-12 NOTE — Progress Notes (Signed)
No show

## 2022-10-28 DIAGNOSIS — Z3480 Encounter for supervision of other normal pregnancy, unspecified trimester: Secondary | ICD-10-CM | POA: Insufficient documentation

## 2022-10-28 DIAGNOSIS — Z23 Encounter for immunization: Secondary | ICD-10-CM | POA: Insufficient documentation

## 2022-11-16 DIAGNOSIS — Z3493 Encounter for supervision of normal pregnancy, unspecified, third trimester: Secondary | ICD-10-CM | POA: Insufficient documentation

## 2023-06-21 ENCOUNTER — Ambulatory Visit (INDEPENDENT_AMBULATORY_CARE_PROVIDER_SITE_OTHER): Payer: MEDICAID

## 2023-06-21 ENCOUNTER — Ambulatory Visit
Admission: EM | Admit: 2023-06-21 | Discharge: 2023-06-21 | Disposition: A | Discharge status: Home / Self Care | Payer: MEDICAID | Attending: Family Medicine | Admitting: Family Medicine

## 2023-06-21 DIAGNOSIS — M25552 Pain in left hip: Secondary | ICD-10-CM

## 2023-06-21 LAB — POCT URINE PREGNANCY: Preg Test, Ur: NEGATIVE

## 2023-06-21 MED ORDER — IBUPROFEN 800 MG PO TABS: 800.0000 mg | ORAL_TABLET | Freq: Three times a day (TID) | ORAL | 0 refills | Status: AC | PRN

## 2023-06-21 NOTE — ED Triage Notes (Signed)
"  On Sunday I fell down 4 brick step hurting my left hip/buttocks and now the pain is not tolerable with anything I do". I did hit my head when this happened but no LOC. I also hit my left shoulder. No nausea or vomiting since. No lacerations.
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
## 2023-06-21 NOTE — Discharge Instructions (Addendum)
 By my review, there are no broken bones on your x-rays.  The radiologist will also read your x-ray, and if their interpretation differs significantly from mine, and the management of your condition would change, we will call you.  Take ibuprofen 800 mg--1 tab every 8 hours as needed for pain.
